# Patient Record
Sex: Female | Born: 1935 | Race: Black or African American | Hispanic: No
Health system: Southern US, Community
[De-identification: ages and names within clinical notes are randomized; demographics above are authoritative.]

## PROBLEM LIST (undated history)

## (undated) DIAGNOSIS — G9340 Encephalopathy, unspecified: Secondary | ICD-10-CM

## (undated) DIAGNOSIS — M1990 Unspecified osteoarthritis, unspecified site: Secondary | ICD-10-CM

## (undated) DIAGNOSIS — E785 Hyperlipidemia, unspecified: Secondary | ICD-10-CM

## (undated) DIAGNOSIS — I639 Cerebral infarction, unspecified: Secondary | ICD-10-CM

## (undated) DIAGNOSIS — G8100 Flaccid hemiplegia affecting unspecified side: Secondary | ICD-10-CM

## (undated) DIAGNOSIS — E079 Disorder of thyroid, unspecified: Secondary | ICD-10-CM

## (undated) DIAGNOSIS — I10 Essential (primary) hypertension: Secondary | ICD-10-CM

## (undated) DIAGNOSIS — J449 Chronic obstructive pulmonary disease, unspecified: Secondary | ICD-10-CM

## (undated) DIAGNOSIS — D509 Iron deficiency anemia, unspecified: Secondary | ICD-10-CM

## (undated) DIAGNOSIS — R41841 Cognitive communication deficit: Secondary | ICD-10-CM

## (undated) DIAGNOSIS — R1310 Dysphagia, unspecified: Secondary | ICD-10-CM

---

## 2016-07-28 ENCOUNTER — Inpatient Hospital Stay
Admission: AD | Admit: 2016-07-28 | Discharge: 2016-09-04 | Disposition: A | Discharge status: Skilled Nursing Facility | Payer: Self-pay | Source: Ambulatory Visit | Attending: Internal Medicine | Admitting: Internal Medicine

## 2016-07-28 DIAGNOSIS — R1310 Dysphagia, unspecified: Secondary | ICD-10-CM

## 2016-07-28 DIAGNOSIS — R52 Pain, unspecified: Secondary | ICD-10-CM

## 2016-07-28 DIAGNOSIS — J189 Pneumonia, unspecified organism: Secondary | ICD-10-CM

## 2016-07-28 DIAGNOSIS — Z0189 Encounter for other specified special examinations: Secondary | ICD-10-CM

## 2016-07-28 DIAGNOSIS — D72829 Elevated white blood cell count, unspecified: Secondary | ICD-10-CM

## 2016-07-28 DIAGNOSIS — J9690 Respiratory failure, unspecified, unspecified whether with hypoxia or hypercapnia: Secondary | ICD-10-CM

## 2016-07-28 DIAGNOSIS — Z431 Encounter for attention to gastrostomy: Secondary | ICD-10-CM

## 2016-07-29 ENCOUNTER — Other Ambulatory Visit (HOSPITAL_COMMUNITY): Payer: Self-pay

## 2016-07-29 DIAGNOSIS — J9601 Acute respiratory failure with hypoxia: Secondary | ICD-10-CM

## 2016-07-29 DIAGNOSIS — I63512 Cerebral infarction due to unspecified occlusion or stenosis of left middle cerebral artery: Secondary | ICD-10-CM

## 2016-07-29 LAB — COMPREHENSIVE METABOLIC PANEL
ALT: 15 U/L (ref 14–54)
AST: 32 U/L (ref 15–41)
Albumin: 1.9 g/dL — ABNORMAL LOW (ref 3.5–5.0)
Alkaline Phosphatase: 95 U/L (ref 38–126)
Anion gap: 9 (ref 5–15)
BILIRUBIN TOTAL: 0.5 mg/dL (ref 0.3–1.2)
BUN: 16 mg/dL (ref 6–20)
CO2: 22 mmol/L (ref 22–32)
CREATININE: 1.13 mg/dL — AB (ref 0.44–1.00)
Calcium: 8.9 mg/dL (ref 8.9–10.3)
Chloride: 103 mmol/L (ref 101–111)
GFR, EST AFRICAN AMERICAN: 50 mL/min — AB (ref >60)
GFR, EST NON AFRICAN AMERICAN: 43 mL/min — AB (ref >60)
Glucose, Bld: 142 mg/dL — ABNORMAL HIGH (ref 65–99)
POTASSIUM: 3.7 mmol/L (ref 3.5–5.1)
Sodium: 134 mmol/L — ABNORMAL LOW (ref 135–145)
TOTAL PROTEIN: 6.2 g/dL — AB (ref 6.5–8.1)

## 2016-07-29 LAB — BLOOD GAS, ARTERIAL
Acid-Base Excess: 1.9 mmol/L (ref 0.0–2.0)
Bicarbonate: 25.4 mmol/L (ref 20.0–28.0)
FIO2: 28
O2 Saturation: 97.7 %
PEEP: 5 cmH2O
Patient temperature: 98.6
RATE: 12 resp/min
VT: 380 mL
pCO2 arterial: 35.9 mmHg (ref 32.0–48.0)
pH, Arterial: 7.464 — ABNORMAL HIGH (ref 7.350–7.450)
pO2, Arterial: 101 mmHg (ref 83.0–108.0)

## 2016-07-29 LAB — CBC
HCT: 29.2 % — ABNORMAL LOW (ref 36.0–46.0)
Hemoglobin: 9.3 g/dL — ABNORMAL LOW (ref 12.0–15.0)
MCH: 31.7 pg (ref 26.0–34.0)
MCHC: 31.8 g/dL (ref 30.0–36.0)
MCV: 99.7 fL (ref 78.0–100.0)
PLATELETS: 354 10*3/uL (ref 150–400)
RBC: 2.93 MIL/uL — AB (ref 3.87–5.11)
RDW: 16.6 % — AB (ref 11.5–15.5)
WBC: 18.1 10*3/uL — AB (ref 4.0–10.5)

## 2016-07-29 LAB — PROTIME-INR
INR: 1.34
Prothrombin Time: 16.7 seconds — ABNORMAL HIGH (ref 11.4–15.2)

## 2016-07-29 NOTE — Consult Note (Signed)
Name: Evelyn [name] MRN: [id number] DOB: 05-Oct-1930    ADMISSION DATE:  07/28/2016 CONSULTATION DATE:  9/30  REFERRING MD :  Sharyon Medicus   CHIEF COMPLAINT:  Vent weaning   BRIEF PATIENT DESCRIPTION:  This is a 82 year old female who suffered a large left MCA stroke on 9/17. She received emergent IV TPA and underwent left ICA/MCA thrombectomy. Hospital course c/b respiratory failure on 9/20 and failure to return to progress from a neurological stand-point. She was transferred to [facility] w/ plans for trach->PEG->then SNF placement   SIGNIFICANT EVENTS    STUDIES:     HISTORY OF PRESENT ILLNESS:   82 year old female who was admitted to [facility] on 9/17 w/ right sided hemiplegia and aphasia. She was given IV TPA at [facility] then transferred emergently to [facility] for: she was given IV tPA. She was transferred to [facility] for  L ICA thrombectomy was documented at 0118 on 07/17/16 by Dr. Kelby Aline. Post-operatively she was admitted to the Neuro-ICU. F/U imaging confirmed stable large Left MCA CVA w/out hemorrhage (last CT 9/21).  Hospital course was c/b acute respiratory failure on 9/20 requiring intubation. She underwent bronchoscopy for mucous plugging, and started on zosyn for aspiration for which she completed 7 days.   PAST MEDICAL HISTORY :  HTN, OA, chronic pain on narcotics, and a hospital admission for dehydration and metabolic acidosis  Social History  . Marital status: Widowed  - non-smoker   Family History  . Heart disease Mother  . Hypertension Mother  . Stroke Brother  . Hypertension Brother  . Heart disease Daughter  . Stroke Daughter    Allergen Reactions  . Erythromycin Ethylsuccinate [E.E.S.] Unknown     Prior to Admission medications   acetaminophen (TYLENOL) 325 mg tablet Take two tablets (650 mg total) by mouth every 4 (four) hours as needed for Pain or Fever (pain level 1-2 or temperature greater than 99.6 degrees F (First choice if able to tolerate oral  medications)). Start date: 07/28/2016, End date: 08/07/2016   albuterol sulfate HFA 108 (90 Base) MCG/ACT inhaler Inhale two puffs into the lungs 4 (four) times daily. Start date: 07/28/2016, End date: 07/28/2017   aspirin 325 mg tablet Take one tablet (325 mg total) by mouth daily. Start date: 07/28/2016, End date: 07/28/2017   atorvastatin (LIPITOR) 40 mg tablet Take one tablet (40 mg total) by mouth at bedtime. Start date: 07/28/2016, End date: 07/28/2017   chlorhexidine (PERIDEX) 0.12% solution Use as directed 15 mLs in the mouth or throat 2 (two) times daily. Start date: 07/28/2016, End date: 08/11/2016   FLUoxetine (PROZAC) 20 mg/5 mL solution Take 5 mLs (20 mg total) by mouth daily. Start date: 07/28/2016, End date: 07/28/2017   heparin 5000 units/mL injection Inject 1 mL (5,000 Units total) into the skin every 8 (eight) hours. Start date: 07/28/2016      on File   Allergies not on file  FAMILY HISTORY:  family history is not on file. SOCIAL HISTORY:    REVIEW OF SYSTEMS:   Not able   SUBJECTIVE:  Minimally responsive on vent  VITAL SIGNS: 99.2 HR 57, rr 18 bp 139/58 sats 100% 28% FIO2 PHYSICAL EXAMINATION: General:  Frail chronically ill appearing AAF. Minimally responsive  Neuro:  Opens eyes to loud stimuli, + cough otherwise no movement  HEENT:  Temporal wasting. Orally intubated, poor dentition  Cardiovascular:  RRR, no MRG Lungs:  Scattered rhonchi. No accessory use. Equal chest rise on vent  Abdomen:  Soft, not tender + bowel sounds  Musculoskeletal:  Equal bulk, no swelling  Skin:  Warm and dry   Recent Labs Lab 07/29/16 0753  NA 134*  K 3.7  CL 103  CO2 22  BUN 16  CREATININE 1.13*  GLUCOSE 142*    Recent Labs Lab 07/29/16 0753  HGB 9.3*  HCT 29.2*  WBC 18.1*  PLT 354  ABG No results found for: PHART, PCO2ART, PO2ART, HCO3, TCO2, ACIDBASEDEF, O2SAT  Dg Chest Port 1 View  Result Date: 07/29/2016 CLINICAL DATA:  Respiratory failure. EXAM:  PORTABLE CHEST 1 VIEW COMPARISON:  None. FINDINGS: Endotracheal tube tip measures 2.5 cm above the carina. 2 enteric tubes are suggested with 1 tip below the left hemidiaphragm and off the field of view and the other tip projected over the distal esophagus. Left PICC line central venous catheter with tip over the low SVC region. No pneumothorax. Cardiac enlargement without vascular congestion. Calcification of the aorta. Hazy opacity in the right lung base likely to represent small effusion and basilar atelectasis. IMPRESSION: Appliances positioned as described with apparently 2 enteric tubes, 1 tip below the left hemidiaphragm and 1 tip at the level of the EG junction. Probable right pleural effusion and basilar atelectasis. Electronically Signed   By: Burman NievesWilliam  Stevens M.D.   On: 07/29/2016 03:09   Dg Abd Portable 1v  Result Date: 07/29/2016 CLINICAL DATA:  NG tube placement. EXAM: PORTABLE ABDOMEN - 1 VIEW COMPARISON:  None. FINDINGS: Enteric tube tip is in the mid abdomen consistent with location in the distal stomach. Scattered gas in the colon and small bowel without distention. No radiopaque stones. IMPRESSION: Enteric tube tip is in the mid abdomen consistent with location in the distal stomach. Nonobstructive bowel gas pattern. Electronically Signed   By: Burman NievesWilliam  Stevens M.D.   On: 07/29/2016 03:07    ASSESSMENT / PLAN: Left MCA stroke Severe dysphasia  Acute encephalopathy  Ventilator dependence  Failure to wean  Severe deconditioning  Protein calorie malnutrition  Inability to protect airway HTN Recent aspiration PNA Hyperglycemia Anemia of critical illness  Pulmonary issues: Ventilator Dependence/failure to wean s/p large Left MCA stroke ->not able to protect airway ->will need trach if family continues to desire supportive care ->prognosis for meaningful recovery poor at best.   Plan [facility] Will need ENT consult for trach Minimize sedation   Simonne MartinetPeter E  Chelby Salata ACNP-BC [facility] Pager # [phone] OR # [phone] if no answer  07/29/2016, 2:21 PM

## 2016-07-30 LAB — CBC WITH DIFFERENTIAL/PLATELET
BASOS PCT: 0 %
Basophils Absolute: 0 10*3/uL (ref 0.0–0.1)
EOS ABS: 0.4 10*3/uL (ref 0.0–0.7)
EOS PCT: 2 %
HCT: 31.5 % — ABNORMAL LOW (ref 36.0–46.0)
Hemoglobin: 9.8 g/dL — ABNORMAL LOW (ref 12.0–15.0)
Lymphocytes Relative: 4 %
Lymphs Abs: 0.7 10*3/uL (ref 0.7–4.0)
MCH: 31.4 pg (ref 26.0–34.0)
MCHC: 31.1 g/dL (ref 30.0–36.0)
MCV: 101 fL — ABNORMAL HIGH (ref 78.0–100.0)
MONO ABS: 1.6 10*3/uL — AB (ref 0.1–1.0)
MONOS PCT: 9 %
Neutro Abs: 15.9 10*3/uL — ABNORMAL HIGH (ref 1.7–7.7)
Neutrophils Relative %: 85 %
PLATELETS: 387 10*3/uL (ref 150–400)
RBC: 3.12 MIL/uL — ABNORMAL LOW (ref 3.87–5.11)
RDW: 16.5 % — AB (ref 11.5–15.5)
WBC: 18.7 10*3/uL — ABNORMAL HIGH (ref 4.0–10.5)

## 2016-07-30 LAB — BASIC METABOLIC PANEL
Anion gap: 9 (ref 5–15)
BUN: 21 mg/dL — ABNORMAL HIGH (ref 6–20)
CALCIUM: 9.4 mg/dL (ref 8.9–10.3)
CO2: 25 mmol/L (ref 22–32)
CREATININE: 1.24 mg/dL — AB (ref 0.44–1.00)
Chloride: 105 mmol/L (ref 101–111)
GFR calc non Af Amer: 39 mL/min — ABNORMAL LOW (ref >60)
GFR, EST AFRICAN AMERICAN: 45 mL/min — AB (ref >60)
Glucose, Bld: 139 mg/dL — ABNORMAL HIGH (ref 65–99)
Potassium: 4.4 mmol/L (ref 3.5–5.1)
SODIUM: 139 mmol/L (ref 135–145)

## 2016-07-31 ENCOUNTER — Other Ambulatory Visit (HOSPITAL_COMMUNITY): Payer: Self-pay

## 2016-07-31 LAB — CBC WITH DIFFERENTIAL/PLATELET
BASOS ABS: 0 10*3/uL (ref 0.0–0.1)
BASOS PCT: 0 %
EOS PCT: 2 %
Eosinophils Absolute: 0.3 10*3/uL (ref 0.0–0.7)
HEMATOCRIT: 31.7 % — AB (ref 36.0–46.0)
Hemoglobin: 9.9 g/dL — ABNORMAL LOW (ref 12.0–15.0)
LYMPHS PCT: 3 %
Lymphs Abs: 0.6 10*3/uL — ABNORMAL LOW (ref 0.7–4.0)
MCH: 31.3 pg (ref 26.0–34.0)
MCHC: 31.2 g/dL (ref 30.0–36.0)
MCV: 100.3 fL — AB (ref 78.0–100.0)
MONO ABS: 1.6 10*3/uL — AB (ref 0.1–1.0)
MONOS PCT: 8 %
Neutro Abs: 16.6 10*3/uL — ABNORMAL HIGH (ref 1.7–7.7)
Neutrophils Relative %: 87 %
PLATELETS: 409 10*3/uL — AB (ref 150–400)
RBC: 3.16 MIL/uL — ABNORMAL LOW (ref 3.87–5.11)
RDW: 16.5 % — AB (ref 11.5–15.5)
WBC: 19.1 10*3/uL — ABNORMAL HIGH (ref 4.0–10.5)

## 2016-07-31 LAB — URINE MICROSCOPIC-ADD ON

## 2016-07-31 LAB — URINALYSIS, ROUTINE W REFLEX MICROSCOPIC
BILIRUBIN URINE: NEGATIVE
GLUCOSE, UA: NEGATIVE mg/dL
KETONES UR: NEGATIVE mg/dL
Nitrite: NEGATIVE
PH: 6.5 (ref 5.0–8.0)
PROTEIN: 100 mg/dL — AB
Specific Gravity, Urine: 1.016 (ref 1.005–1.030)

## 2016-08-02 LAB — CULTURE, RESPIRATORY W GRAM STAIN: Culture: NO GROWTH

## 2016-08-02 LAB — CULTURE, RESPIRATORY

## 2016-08-03 DIAGNOSIS — Z515 Encounter for palliative care: Secondary | ICD-10-CM

## 2016-08-03 DIAGNOSIS — G9340 Encephalopathy, unspecified: Secondary | ICD-10-CM

## 2016-08-03 LAB — CBC
HCT: 25.5 % — ABNORMAL LOW (ref 36.0–46.0)
Hemoglobin: 8 g/dL — ABNORMAL LOW (ref 12.0–15.0)
MCH: 31.4 pg (ref 26.0–34.0)
MCHC: 31.4 g/dL (ref 30.0–36.0)
MCV: 100 fL (ref 78.0–100.0)
PLATELETS: 367 10*3/uL (ref 150–400)
RBC: 2.55 MIL/uL — ABNORMAL LOW (ref 3.87–5.11)
RDW: 16.3 % — AB (ref 11.5–15.5)
WBC: 12.4 10*3/uL — AB (ref 4.0–10.5)

## 2016-08-03 LAB — BASIC METABOLIC PANEL
ANION GAP: 9 (ref 5–15)
BUN: 24 mg/dL — ABNORMAL HIGH (ref 6–20)
CALCIUM: 9.4 mg/dL (ref 8.9–10.3)
CO2: 28 mmol/L (ref 22–32)
CREATININE: 1.46 mg/dL — AB (ref 0.44–1.00)
Chloride: 96 mmol/L — ABNORMAL LOW (ref 101–111)
GFR calc Af Amer: 37 mL/min — ABNORMAL LOW (ref >60)
GFR, EST NON AFRICAN AMERICAN: 32 mL/min — AB (ref >60)
GLUCOSE: 132 mg/dL — AB (ref 65–99)
Potassium: 4.5 mmol/L (ref 3.5–5.1)
Sodium: 133 mmol/L — ABNORMAL LOW (ref 135–145)

## 2016-08-03 LAB — URINE CULTURE: SPECIAL REQUESTS: NORMAL

## 2016-08-03 NOTE — Progress Notes (Signed)
Name: Evelyn [name] MRN: [id number] DOB: [birth year]    ADMISSION DATE:  07/28/2016 CONSULTATION DATE:  9/30  REFERRING MD :  Sharyon MedicusHijazi   CHIEF COMPLAINT:  Vent weaning   BRIEF PATIENT DESCRIPTION:  This is a 82 year old female who suffered a large left MCA stroke on 9/17. She received emergent IV TPA and underwent left ICA/MCA thrombectomy. Hospital course c/b respiratory failure on 9/20 and failure to return to progress from a neurological stand-point. [facility]/ [facility] for [facility]->PEG->then SNF placement   SIGNIFICANT EVENTS    STUDIES:    HISTORY OF PRESENT ILLNESS:   82 year old female who was admitted to [facility] on 9/17 w/ right sided hemiplegia and aphasia. She was given IV TPA at [place] [facility] she was given IV t[facility]. [facility]  L ICA thrombectomy was documented at 0118 on 07/17/16 by Dr. Kelby AlineJanjua. Post-operatively she was admitted to the Neuro-ICU. F/U imaging confirmed stable large Left MCA CVA w/out hemorrhage (last CT 9/21).  Hospital course was c/b acute respiratory failure on 9/20 requiring intubation. She underwent bronchoscopy for mucous plugging, and started on zosyn for aspiration for which she completed 7 days.     SUBJECTIVE:  Minimally responsive on vent  VITAL SIGNS: 99.3 97 129/73 100% 18  Ps 12  28% 5 peep Svt 350 cc    PHYSICAL EXAMINATION: General:  Frail chronically ill appearing AAF. Minimally responsive  Neuro:  Opens eyes to loud stimuli, + cough otherwise no movement  HEENT:  Temporal wasting. Orally intubated, poor dentition  Cardiovascular:  RRR, no MRG Lungs:  Scattered rhonchi. No accessory use. Equal chest rise on vent  Abdomen:  Soft, not tender + bowel sounds  Musculoskeletal:  Equal bulk, no swelling  Skin:  Warm and dry   Recent Labs Lab 07/29/16 0753 07/30/16 0617 08/03/16 0500  NA 134* 139 133*  K 3.7 4.4 4.5  CL 103 105 96*  CO2 22  25 28   BUN 16 21* 24*  CREATININE 1.13* 1.24* 1.46*  GLUCOSE 142* 139* 132*    Recent Labs Lab 07/30/16 0617 07/31/16 1117 08/03/16 0500  HGB 9.8* 9.9* 8.0*  HCT 31.5* 31.7* 25.5*  WBC 18.7* 19.1* 12.4*  PLT 387 409* 367  ABG    Component Value Date/Time   PHART 7.464 (H) 07/29/2016 1540   PCO2ART 35.9 07/29/2016 1540   PO2ART 101 07/29/2016 1540   HCO3 25.4 07/29/2016 1540   O2SAT 97.7 07/29/2016 1540    No results found.  ASSESSMENT / PLAN: Left MCA stroke Severe dysphasia  Acute encephalopathy  Ventilator dependence  Failure to wean  Severe deconditioning  Protein calorie malnutrition  Inability to protect airway HTN Recent aspiration PNA Hyperglycemia Anemia of critical illness  Pulmonary issues: Ventilator Dependence/failure to wean s/p large Left MCA stroke ->not able to protect airway ->will need trach if family continues to desire supportive care ->prognosis for meaningful recovery poor at best.   [facility] Will need ENT consult for trach Minimize sedation  PCCM will see weekly on mondays  [facility] ACNP [facility] Pager [phone] till 3 pm If no answer page [phone] 08/03/2016, 10:44 AM  Attending Note:  82 year old female with extensive PMH who is completely unresponsive on exam, remains vegetative.  I reviewed CXR myself, ETT ok.  Discussed with PCCM-NP.  This is completely futile situation unfortunately where  care provided will not result in any sort of meaningful recovery.    Respiratory failure:   - Recommend withdrawal to comfort.  Acute encephalopathy due to CVA.  - No sedation.  Hypoxemia:  - Titrate O2 for sat of 88-92%.  Airway: currently intubated.  - Not a candidate for trach/peg.  - SSH-MD to discuss plan of care.  - PCCM will not trach.  PCCM will sign off, please call back if needed.  Patient seen and examined, agree with above note.  I dictated the care and orders written for this  patient under my direction.  Alyson Reedy, MD [phone]

## 2016-08-05 LAB — BASIC METABOLIC PANEL
ANION GAP: 6 (ref 5–15)
BUN: 34 mg/dL — ABNORMAL HIGH (ref 6–20)
CO2: 29 mmol/L (ref 22–32)
Calcium: 9.3 mg/dL (ref 8.9–10.3)
Chloride: 99 mmol/L — ABNORMAL LOW (ref 101–111)
Creatinine, Ser: 1.58 mg/dL — ABNORMAL HIGH (ref 0.44–1.00)
GFR, EST AFRICAN AMERICAN: 34 mL/min — AB (ref >60)
GFR, EST NON AFRICAN AMERICAN: 29 mL/min — AB (ref >60)
GLUCOSE: 114 mg/dL — AB (ref 65–99)
POTASSIUM: 4.7 mmol/L (ref 3.5–5.1)
Sodium: 134 mmol/L — ABNORMAL LOW (ref 135–145)

## 2016-08-05 LAB — BLOOD GAS, ARTERIAL
ACID-BASE EXCESS: 3.9 mmol/L — AB (ref 0.0–2.0)
Bicarbonate: 27.7 mmol/L (ref 20.0–28.0)
FIO2: 28
O2 Saturation: 97.5 %
PEEP: 5 cmH2O
Patient temperature: 98.3
Pressure support: 5 cmH2O
pCO2 arterial: 39.5 mmHg (ref 32.0–48.0)
pH, Arterial: 7.459 — ABNORMAL HIGH (ref 7.350–7.450)
pO2, Arterial: 92.4 mmHg (ref 83.0–108.0)

## 2016-08-05 LAB — CBC
HEMATOCRIT: 24.5 % — AB (ref 36.0–46.0)
Hemoglobin: 7.6 g/dL — ABNORMAL LOW (ref 12.0–15.0)
MCH: 31.4 pg (ref 26.0–34.0)
MCHC: 31 g/dL (ref 30.0–36.0)
MCV: 101.2 fL — AB (ref 78.0–100.0)
PLATELETS: 383 10*3/uL (ref 150–400)
RBC: 2.42 MIL/uL — AB (ref 3.87–5.11)
RDW: 16.3 % — ABNORMAL HIGH (ref 11.5–15.5)
WBC: 10.4 10*3/uL (ref 4.0–10.5)

## 2016-08-10 LAB — CBC
HEMATOCRIT: 28 % — AB (ref 36.0–46.0)
HEMOGLOBIN: 8.9 g/dL — AB (ref 12.0–15.0)
MCH: 31.4 pg (ref 26.0–34.0)
MCHC: 31.8 g/dL (ref 30.0–36.0)
MCV: 98.9 fL (ref 78.0–100.0)
Platelets: 373 10*3/uL (ref 150–400)
RBC: 2.83 MIL/uL — AB (ref 3.87–5.11)
RDW: 16.3 % — ABNORMAL HIGH (ref 11.5–15.5)
WBC: 10.4 10*3/uL (ref 4.0–10.5)

## 2016-08-11 ENCOUNTER — Encounter
Admission: AD | Disposition: A | Discharge status: Skilled Nursing Facility | Payer: Self-pay | Source: Ambulatory Visit | Attending: Internal Medicine

## 2016-08-11 ENCOUNTER — Ambulatory Visit (HOSPITAL_COMMUNITY): Payer: Self-pay | Admitting: Certified Registered"

## 2016-08-11 ENCOUNTER — Institutional Professional Consult (permissible substitution) (HOSPITAL_COMMUNITY): Payer: Self-pay

## 2016-08-11 ENCOUNTER — Encounter (HOSPITAL_COMMUNITY): Payer: Self-pay | Admitting: Certified Registered"

## 2016-08-11 HISTORY — PX: TRACHEOSTOMY TUBE PLACEMENT: SHX814

## 2016-08-11 LAB — CBC
HCT: 29.2 % — ABNORMAL LOW (ref 36.0–46.0)
Hemoglobin: 9.4 g/dL — ABNORMAL LOW (ref 12.0–15.0)
MCH: 31.9 pg (ref 26.0–34.0)
MCHC: 32.2 g/dL (ref 30.0–36.0)
MCV: 99 fL (ref 78.0–100.0)
PLATELETS: 383 10*3/uL (ref 150–400)
RBC: 2.95 MIL/uL — AB (ref 3.87–5.11)
RDW: 16.3 % — AB (ref 11.5–15.5)
WBC: 14 10*3/uL — AB (ref 4.0–10.5)

## 2016-08-11 LAB — COMPREHENSIVE METABOLIC PANEL
ALT: 251 U/L — AB (ref 14–54)
AST: 156 U/L — AB (ref 15–41)
Albumin: 2.4 g/dL — ABNORMAL LOW (ref 3.5–5.0)
Alkaline Phosphatase: 881 U/L — ABNORMAL HIGH (ref 38–126)
Anion gap: 10 (ref 5–15)
BILIRUBIN TOTAL: 0.4 mg/dL (ref 0.3–1.2)
BUN: 40 mg/dL — AB (ref 6–20)
CO2: 25 mmol/L (ref 22–32)
CREATININE: 1.34 mg/dL — AB (ref 0.44–1.00)
Calcium: 10.3 mg/dL (ref 8.9–10.3)
Chloride: 103 mmol/L (ref 101–111)
GFR, EST AFRICAN AMERICAN: 41 mL/min — AB (ref >60)
GFR, EST NON AFRICAN AMERICAN: 35 mL/min — AB (ref >60)
Glucose, Bld: 110 mg/dL — ABNORMAL HIGH (ref 65–99)
POTASSIUM: 5 mmol/L (ref 3.5–5.1)
Sodium: 138 mmol/L (ref 135–145)
TOTAL PROTEIN: 7.8 g/dL (ref 6.5–8.1)

## 2016-08-11 SURGERY — CREATION, TRACHEOSTOMY
Anesthesia: General | Site: Neck

## 2016-08-11 MED ORDER — ROCURONIUM BROMIDE 100 MG/10ML IV SOLN
INTRAVENOUS | Status: DC | PRN
  Administered 2016-08-11: 20 mg via INTRAVENOUS

## 2016-08-11 MED ORDER — LIDOCAINE-EPINEPHRINE 1 %-1:100000 IJ SOLN
INTRAMUSCULAR | Status: AC
  Filled 2016-08-11: qty 1

## 2016-08-11 MED ORDER — 0.9 % SODIUM CHLORIDE (POUR BTL) OPTIME
TOPICAL | Status: DC | PRN
  Administered 2016-08-11: 1000 mL

## 2016-08-11 MED ORDER — LACTATED RINGERS IV SOLN
INTRAVENOUS | Status: DC | PRN
  Administered 2016-08-11: 11:00:00 via INTRAVENOUS

## 2016-08-11 MED ORDER — EPHEDRINE SULFATE 50 MG/ML IJ SOLN
INTRAMUSCULAR | Status: DC | PRN
  Administered 2016-08-11: 10 mg via INTRAVENOUS

## 2016-08-11 MED ORDER — LIDOCAINE-EPINEPHRINE 1 %-1:100000 IJ SOLN
INTRAMUSCULAR | Status: DC | PRN
  Administered 2016-08-11: 3 mL via INTRADERMAL

## 2016-08-11 SURGICAL SUPPLY — 43 items
ATTRACTOMAT 16X20 MAGNETIC DRP (DRAPES) IMPLANT
BLADE SURG 15 STRL LF DISP TIS (BLADE) ×2 IMPLANT
BLADE SURG 15 STRL SS (BLADE) ×4
CLEANER TIP ELECTROSURG 2X2 (MISCELLANEOUS) ×3 IMPLANT
COVER SURGICAL LIGHT HANDLE (MISCELLANEOUS) ×3 IMPLANT
DRAPE PROXIMA HALF (DRAPES) ×3 IMPLANT
ELECT COATED BLADE 2.86 ST (ELECTRODE) ×3 IMPLANT
ELECT REM PT RETURN 9FT ADLT (ELECTROSURGICAL) ×3
ELECTRODE REM PT RTRN 9FT ADLT (ELECTROSURGICAL) ×1 IMPLANT
GAUZE SPONGE 4X4 16PLY XRAY LF (GAUZE/BANDAGES/DRESSINGS) ×3 IMPLANT
GEL ULTRASOUND 20GR AQUASONIC (MISCELLANEOUS) ×3 IMPLANT
GLOVE BIO SURGEON STRL SZ7 (GLOVE) ×3 IMPLANT
GLOVE INDICATOR 7.5 STRL GRN (GLOVE) ×3 IMPLANT
GLOVE SS BIOGEL STRL SZ 7.5 (GLOVE) ×1 IMPLANT
GLOVE SUPERSENSE BIOGEL SZ 7.5 (GLOVE) ×2
GOWN STRL REUS W/ TWL LRG LVL3 (GOWN DISPOSABLE) ×1 IMPLANT
GOWN STRL REUS W/ TWL XL LVL3 (GOWN DISPOSABLE) ×2 IMPLANT
GOWN STRL REUS W/TWL LRG LVL3 (GOWN DISPOSABLE) ×2
GOWN STRL REUS W/TWL XL LVL3 (GOWN DISPOSABLE) ×4
HOLDER TRACH TUBE VELCRO 19.5 (MISCELLANEOUS) ×3 IMPLANT
KIT BASIN OR (CUSTOM PROCEDURE TRAY) ×3 IMPLANT
KIT ROOM TURNOVER OR (KITS) ×3 IMPLANT
KIT SUCTION CATH 14FR (SUCTIONS) ×3 IMPLANT
NEEDLE HYPO 25GX1X1/2 BEV (NEEDLE) ×3 IMPLANT
NS IRRIG 1000ML POUR BTL (IV SOLUTION) ×3 IMPLANT
PACK EENT II TURBAN DRAPE (CUSTOM PROCEDURE TRAY) ×3 IMPLANT
PAD ARMBOARD 7.5X6 YLW CONV (MISCELLANEOUS) ×6 IMPLANT
PENCIL BUTTON HOLSTER BLD 10FT (ELECTRODE) ×3 IMPLANT
SOL PREP POV-IOD 4OZ 10% (MISCELLANEOUS) ×3 IMPLANT
SPONGE DRAIN TRACH 4X4 STRL 2S (GAUZE/BANDAGES/DRESSINGS) ×3 IMPLANT
SPONGE INTESTINAL PEANUT (DISPOSABLE) ×3 IMPLANT
SURGILUBE 2OZ TUBE FLIPTOP (MISCELLANEOUS) ×3 IMPLANT
SUT SILK 2 0 SH CR/8 (SUTURE) ×3 IMPLANT
SUT SILK 3 0 TIES 10X30 (SUTURE) ×3 IMPLANT
SYR 5ML LUER SLIP (SYRINGE) ×3 IMPLANT
SYR CONTROL 10ML LL (SYRINGE) ×3 IMPLANT
TOWEL OR 17X24 6PK STRL BLUE (TOWEL DISPOSABLE) ×3 IMPLANT
TOWEL OR 17X26 10 PK STRL BLUE (TOWEL DISPOSABLE) ×3 IMPLANT
TUBE CONNECTING 12'X1/4 (SUCTIONS) ×1
TUBE CONNECTING 12X1/4 (SUCTIONS) ×2 IMPLANT
TUBE TRACH SHILEY  6 DIST  CUF (TUBING) ×3 IMPLANT
TUBE TRACH SHILEY 10 DIST CUFF (TUBING) IMPLANT
TUBE TRACH SHILEY 8 DIST CUF (TUBING) IMPLANT

## 2016-08-11 NOTE — Interval H&P Note (Signed)
History and Physical Interval Note:  08/11/2016 11:09 AM  Evelyn [name]  has presented today for surgery, with the diagnosis of Respiratory Failure  The various methods of treatment have been discussed with the patient and family. After consideration of risks, benefits and other options for treatment, the patient has consented to  Procedure(s): TRACHEOSTOMY (N/A) as a surgical intervention .  The patient's history has been reviewed, patient examined, no change in status, stable for surgery.  I have reviewed the patient's chart and labs.  Questions were answered to the patient's satisfaction.     [name]

## 2016-08-11 NOTE — H&P (Signed)
PREOPERATIVE H&P  Chief Complaint: VDRF  HPI: Evelyn [name] is a 82 y.o. female who presents for evaluation of tracheostomy. She's status post severe left stroke with obstruction  of the left ICA/MCA. She was intubated shortly following the stroke about 3 weeks ago. [facility]. She has been unable to wean off the vent and trach was recommended.  No past medical history on file. No past surgical history on file. Social History   Social History  . Marital status: Widowed    Spouse name: N/A  . Number of children: N/A  . Years of education: N/A   Social History Main Topics  . Smoking status: Not on file  . Smokeless tobacco: Not on file  . Alcohol use Not on file  . Drug use: Unknown  . Sexual activity: Not on file   Other Topics Concern  . Not on file   Social History Narrative  . No narrative on file   No family history on file. Allergies  Allergen Reactions  . Erythromycin Itching   Prior to Admission medications   Not on File     Positive ROS: neg  All other systems have been reviewed and were otherwise negative with the exception of those mentioned in the HPI and as above.  Physical Exam: There were no vitals filed for this visit.  General: Not responsive. Intubated on vent Oral: Nasal: NG tube in place Neck: No palpable adenopathy or thyroid nodules. Trach midline. Ear: Cardiovascular: Regular rate and rhythm, no murmur.  Respiratory: Clear to auscultation Neurologic:    Assessment/Plan: Respiratory Failure Plan for Procedure(s): TRACHEOSTOMY   Dillard CannonHRISTOPHER Lylla Eifler, MD 08/11/2016 11:03 AM

## 2016-08-11 NOTE — Transfer of Care (Signed)
Immediate Anesthesia Transfer of Care Note  Patient: Evelyn [name]  Procedure(s) Performed: Procedure(s): TRACHEOSTOMY (N/A)  Patient Location: Nursing Unit  Anesthesia Type:General  Level of Consciousness: Patient remains intubated per anesthesia plan  Airway & Oxygen Therapy: Patient remains intubated per anesthesia plan and Patient placed on Ventilator (see vital sign flow sheet for setting)  Post-op Assessment: Report given to RN and Post -op Vital signs reviewed and stable  Post vital signs: Reviewed and stable  Last Vitals: There were no vitals filed for this visit.  Last Pain: There were no vitals filed for this visit.       Complications: No apparent anesthesia complications

## 2016-08-11 NOTE — Brief Op Note (Signed)
07/28/2016 - 08/11/2016  11:47 AM  PATIENT:  Monna FamHazetta L Lenderman  82 y.o. female  PRE-OPERATIVE DIAGNOSIS:  Respiratory Failure  POST-OPERATIVE DIAGNOSIS:  Respiratory Failure  PROCEDURE:  Procedure(s): TRACHEOSTOMY (N/A) #6 Shiley cuffed  SURGEON:  Surgeon(s) and Role:    * Drema Halonhristopher E Newman, MD - Primary  PHYSICIAN ASSISTANT:   ASSISTANTS: none   ANESTHESIA:   general  EBL:  Total I/O In: 200 [I.V.:200] Out: 20 [Blood:20]  BLOOD ADMINISTERED:none  DRAINS: none   LOCAL MEDICATIONS USED:  LIDOCAINE with EPI 3 cc  SPECIMEN:  No Specimen  DISPOSITION OF SPECIMEN:  N/A  COUNTS:  YES  TOURNIQUET:  * No tourniquets in log *  DICTATION: .Other Dictation: Dictation Number [id number]  PLAN OF CARE: Discharge to home after PACU  PATIENT DISPOSITION:  PACU - hemodynamically stable.   Delay start of Pharmacological VTE agent (>24hrs) due to surgical blood loss or risk of bleeding: yes

## 2016-08-11 NOTE — Anesthesia Preprocedure Evaluation (Signed)
Anesthesia Evaluation  Patient identified by MRN, date of birth, ID band Patient unresponsive    Reviewed: Unable to perform ROS - Chart review only  Airway Mallampati: Intubated       Dental   Pulmonary  Respiratory failure.  Intubated.    + decreased breath sounds      Cardiovascular negative cardio ROS   Rhythm:regular Rate:Normal     Neuro/Psych CVA    GI/Hepatic   Endo/Other    Renal/GU Renal InsufficiencyRenal disease     Musculoskeletal   Abdominal   Peds  Hematology   Anesthesia Other Findings   Reproductive/Obstetrics                             Anesthesia Physical Anesthesia Plan  ASA: IV  Anesthesia Plan: General   Post-op Pain Management:    Induction: Inhalational  Airway Management Planned: Oral ETT and Tracheostomy  Additional Equipment:   Intra-op Plan:   Post-operative Plan: Post-operative intubation/ventilation  Informed Consent: I have reviewed the patients History and Physical, chart, labs and discussed the procedure including the risks, benefits and alternatives for the proposed anesthesia with the patient or authorized representative who has indicated his/her understanding and acceptance.     Plan Discussed with: CRNA, Anesthesiologist and Surgeon  Anesthesia Plan Comments:         Anesthesia Quick Evaluation

## 2016-08-12 LAB — CBC
HCT: 29.1 % — ABNORMAL LOW (ref 36.0–46.0)
Hemoglobin: 9.4 g/dL — ABNORMAL LOW (ref 12.0–15.0)
MCH: 32 pg (ref 26.0–34.0)
MCHC: 32.3 g/dL (ref 30.0–36.0)
MCV: 99 fL (ref 78.0–100.0)
PLATELETS: 386 10*3/uL (ref 150–400)
RBC: 2.94 MIL/uL — ABNORMAL LOW (ref 3.87–5.11)
RDW: 16.5 % — AB (ref 11.5–15.5)
WBC: 12.1 10*3/uL — ABNORMAL HIGH (ref 4.0–10.5)

## 2016-08-12 LAB — PROTIME-INR
INR: 1.29
Prothrombin Time: 16.2 seconds — ABNORMAL HIGH (ref 11.4–15.2)

## 2016-08-12 LAB — PREPARE RBC (CROSSMATCH)

## 2016-08-13 ENCOUNTER — Other Ambulatory Visit (HOSPITAL_COMMUNITY): Payer: Self-pay

## 2016-08-13 LAB — CBC
HEMATOCRIT: 26.5 % — AB (ref 36.0–46.0)
HEMOGLOBIN: 8.3 g/dL — AB (ref 12.0–15.0)
MCH: 31.3 pg (ref 26.0–34.0)
MCHC: 31.3 g/dL (ref 30.0–36.0)
MCV: 100 fL (ref 78.0–100.0)
Platelets: 379 10*3/uL (ref 150–400)
RBC: 2.65 MIL/uL — ABNORMAL LOW (ref 3.87–5.11)
RDW: 16.8 % — ABNORMAL HIGH (ref 11.5–15.5)
WBC: 13.3 10*3/uL — ABNORMAL HIGH (ref 4.0–10.5)

## 2016-08-13 LAB — BASIC METABOLIC PANEL
Anion gap: 7 (ref 5–15)
BUN: 50 mg/dL — ABNORMAL HIGH (ref 6–20)
CHLORIDE: 106 mmol/L (ref 101–111)
CO2: 29 mmol/L (ref 22–32)
CREATININE: 1.39 mg/dL — AB (ref 0.44–1.00)
Calcium: 10.2 mg/dL (ref 8.9–10.3)
GFR calc non Af Amer: 34 mL/min — ABNORMAL LOW (ref >60)
GFR, EST AFRICAN AMERICAN: 39 mL/min — AB (ref >60)
Glucose, Bld: 139 mg/dL — ABNORMAL HIGH (ref 65–99)
Potassium: 5.5 mmol/L — ABNORMAL HIGH (ref 3.5–5.1)
Sodium: 142 mmol/L (ref 135–145)

## 2016-08-14 ENCOUNTER — Other Ambulatory Visit (HOSPITAL_COMMUNITY): Payer: Self-pay

## 2016-08-14 ENCOUNTER — Encounter (HOSPITAL_COMMUNITY): Payer: Self-pay | Admitting: Otolaryngology

## 2016-08-14 LAB — COMPREHENSIVE METABOLIC PANEL
ALBUMIN: 2.8 g/dL — AB (ref 3.5–5.0)
ALT: 92 U/L — ABNORMAL HIGH (ref 14–54)
ANION GAP: 10 (ref 5–15)
AST: 45 U/L — AB (ref 15–41)
Alkaline Phosphatase: 536 U/L — ABNORMAL HIGH (ref 38–126)
BUN: 49 mg/dL — AB (ref 6–20)
CHLORIDE: 105 mmol/L (ref 101–111)
CO2: 28 mmol/L (ref 22–32)
Calcium: 10.6 mg/dL — ABNORMAL HIGH (ref 8.9–10.3)
Creatinine, Ser: 1.47 mg/dL — ABNORMAL HIGH (ref 0.44–1.00)
GFR calc Af Amer: 37 mL/min — ABNORMAL LOW (ref >60)
GFR calc non Af Amer: 32 mL/min — ABNORMAL LOW (ref >60)
GLUCOSE: 150 mg/dL — AB (ref 65–99)
POTASSIUM: 5.1 mmol/L (ref 3.5–5.1)
SODIUM: 143 mmol/L (ref 135–145)
Total Bilirubin: 0.4 mg/dL (ref 0.3–1.2)
Total Protein: 7.9 g/dL (ref 6.5–8.1)

## 2016-08-14 LAB — HEPATITIS PANEL, ACUTE
HEP A IGM: NEGATIVE
HEP B C IGM: NEGATIVE
Hepatitis B Surface Ag: NEGATIVE

## 2016-08-14 LAB — ABO/RH: ABO/RH(D): B POS

## 2016-08-14 NOTE — Anesthesia Postprocedure Evaluation (Signed)
Anesthesia Post Note  Patient: Evelyn [name] Near  Procedure(s) Performed: Procedure(s) (LRB): TRACHEOSTOMY (N/A)  Patient location during evaluation: Nursing Unit Anesthesia Type: General Level of consciousness: sedated Pain management: pain level controlled Vital Signs Assessment: post-procedure vital signs reviewed and stable Respiratory status: patient on ventilator - see flowsheet for VS (tracheostomy) Cardiovascular status: stable Anesthetic complications: no    Last Vitals: There were no vitals filed for this visit.  Last Pain: There were no vitals filed for this visit.               Anniah Glick S

## 2016-08-14 NOTE — Op Note (Signed)
NAMMarland Kitchen:  Evelyn AlaminWILLIAMS, Scottie            ACCOUNT NO.:  [id number]  MEDICAL RECORD NO.:  [id number]  LOCATION:                                 FACILITY:  PHYSICIAN:  Kristine GarbeChristopher E. Ezzard StandingNewman, M.D.DATE OF BIRTH:  [birth year]  DATE OF PROCEDURE:  08/11/2016 DATE OF DISCHARGE:                              OPERATIVE REPORT   PREOPERATIVE DIAGNOSIS:  Ventilator-dependent respiratory failure.  POSTOPERATIVE DIAGNOSIS:  Ventilator-dependent respiratory failure.  OPERATION PERFORMED:  Tracheostomy with a #6 Shiley cuffed tube.  SURGEON:  Kristine GarbeChristopher E. Ezzard StandingNewman, M.D.  ANESTHESIA:  General endotracheal.  COMPLICATIONS:  None.  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  She was intubated because of respiratory failure at the time of her stroke with severe mental status changes.  [facility] about 2 weeks ago and has been intubated and ventilation dependent since that time.  They have tried to wean her, but she has not been able to be weaned successfully.  It was recommended that she undergo a tracheotomy and family members have agreed as the patient is nonresponsive.  She was taken to the operating room at this time for tracheotomy.  DESCRIPTION OF PROCEDURE:  The [facility] to the operating room.  Neck was prepped with Betadine solution, draped out with sterile towels.  Proposed incision site was marked and injected with 3 mL of Xylocaine with epinephrine for hemostasis.  There was a large superficial vein crossing the incision site, and incision was made vertically about 2 cm midline lower neck.  The large vein was dissected out, clamped and ligated with two 3-0 silk ligature and divided.  Then using cauteries, division of the  subcutaneous tissue down to the strap muscles was identified.  The strap muscles were identified and retracted laterally and divided in midline.  The upper three tracheal rings were identified and horizontal tracheotomy was made between the second and third tracheal rings just above the thyroid isthmus.  Hemostasis was obtained with cautery. Horizontal tracheotomy was performed and endotracheal tube was removed and a #6 cuffed Shiley tracheotomy tube was inserted without any difficulty.  The patient was ventilated well.  The new tracheotomy was secured with four 2-0 silk sutures and then Velcro trach collar around the neck.  The patient was subsequently transferred back to [facility] postoperatively.    ______________________________ Kristine Garbehristopher E. Ezzard StandingNewman, M.D.   ______________________________ Kristine Garbehristopher E. Ezzard StandingNewman, M.D.    CEN/MEDQ  D:  08/11/2016  T:  08/12/2016  Job:  [id number]

## 2016-08-15 ENCOUNTER — Encounter: Payer: Self-pay | Admitting: Physician Assistant

## 2016-08-15 NOTE — H&P (Signed)
  Chief Complaint: Stroke Vent dependent respiratory failure  Referring Physician(s): Ali Hijazi  Supervising Physician: Wagner, Jaime  Patient Status: [facility] - In-pt  History of Present Illness: Evelyn [name] is a 82 y.o. female who suffered a large left MCA stroke on 9/17.   She received emergent IV TPA and underwent left ICA/MCA thrombectomy.   Hospital course was complicated by respiratory failure on 9/20 and failure to return to progress from a neurological stand-point.   She was transferred to [facility] w/ plans for tracheostomy and gastrostomy then transfer to SNF.  History reviewed. No pertinent past medical history.  Past Surgical History:  Procedure Laterality Date  . TRACHEOSTOMY TUBE PLACEMENT N/A 08/11/2016   Procedure: TRACHEOSTOMY;  Surgeon: Christopher E Newman, MD;  Location: [facility] OR;  Service: ENT;  Laterality: N/A;    Allergies: Erythromycin  Medications: Prior to Admission medications   Not on File     History reviewed. No pertinent family history.  Social History   Social History  . Marital status: Widowed    Spouse name: N/A  . Number of children: N/A  . Years of education: N/A   Social History Main Topics  . Smoking status: None  . Smokeless tobacco: None  . Alcohol use None  . Drug use: Unknown  . Sexual activity: Not Asked   Other Topics Concern  . None   Social History Narrative  . None      Review of Systems  Unable to perform ROS: Intubated    Vital Signs: There were no vitals taken for this visit.  Physical Exam  Constitutional:  Thin, NAD, on ventilator  HENT:  Head: Atraumatic.  Cardiovascular: Normal rate, regular rhythm and normal heart sounds.   Pulmonary/Chest: She has no wheezes.  Tracheostomy/Ventilator   Abdominal: Soft. She exhibits no distension. There is no tenderness.  Musculoskeletal: She exhibits no edema.  Neurological:  Opens eyes but does not follow commands  Skin: Skin  is warm and dry.  Psychiatric:  Unable to assess    Mallampati Score:  MD Evaluation Airway: Other (comments) Airway comments: Tracheostomy Heart: WNL Abdomen: WNL Chest/ Lungs: WNL ASA  Classification: 4 Mallampati/Airway Score:  (Tracheostomy)  Imaging: Dg Chest Port 1 View  Result Date: 08/11/2016 CLINICAL DATA:  Hypoxia EXAM: PORTABLE CHEST 1 VIEW COMPARISON:  July 31, 2016 FINDINGS: Endotracheal tube tip is 3.0 cm above the carina. Nasogastric tube tip and side-port are below the diaphragm. No pneumothorax. There is a minimal right pleural effusion. Lungs elsewhere clear. Heart is upper normal in size with pulmonary vascularity within normal limits. There is atherosclerotic calcification within the aorta. No adenopathy. IMPRESSION: Tube positions as described without pneumothorax. Small pleural effusion. Lungs elsewhere clear. There is aortic atherosclerosis. Electronically Signed   By: William  Woodruff III M.D.   On: 08/11/2016 07:58   Dg Chest Port 1 View  Result Date: 07/31/2016 CLINICAL DATA:  Clinical diagnosis of pneumonia. EXAM: PORTABLE CHEST 1 VIEW COMPARISON:  07/29/2016. FINDINGS: No significant change in enlargement of the cardiac silhouette. The aorta remains tortuous and calcified. Mildly decreased patchy opacity at the right lung base. Small right pleural effusion without significant change. Decreased patchy opacity at the left lung base. No acute bony abnormality. Endotracheal tube in satisfactory position. Left PICC tip in the superior vena cava. Nasogastric tube extending into the stomach with its side hole in the proximal stomach. Esophageal probe tip in the distal esophagus. IMPRESSION: 1. Improving bibasilar atelectasis and possible pneumonia. 2.   Stable small right pleural effusion. 3. Stable cardiomegaly. 4. Aortic atherosclerosis. Electronically Signed   By: Claudie Revering M.D.   On: 07/31/2016 15:35   Dg Chest Port 1 View  Result Date: 07/29/2016 CLINICAL  DATA:  Respiratory failure. EXAM: PORTABLE CHEST 1 VIEW COMPARISON:  None. FINDINGS: Endotracheal tube tip measures 2.5 cm above the carina. 2 enteric tubes are suggested with 1 tip below the left hemidiaphragm and off the field of view and the other tip projected over the distal esophagus. Left PICC line central venous catheter with tip over the low SVC region. No pneumothorax. Cardiac enlargement without vascular congestion. Calcification of the aorta. Hazy opacity in the right lung base likely to represent small effusion and basilar atelectasis. IMPRESSION: Appliances positioned as described with apparently 2 enteric tubes, 1 tip below the left hemidiaphragm and 1 tip at the level of the EG junction. Probable right pleural effusion and basilar atelectasis. Electronically Signed   By: Lucienne Capers M.D.   On: 07/29/2016 03:09   Dg Abd Portable 1v  Result Date: 07/29/2016 CLINICAL DATA:  NG tube placement. EXAM: PORTABLE ABDOMEN - 1 VIEW COMPARISON:  None. FINDINGS: Enteric tube tip is in the mid abdomen consistent with location in the distal stomach. Scattered gas in the colon and small bowel without distention. No radiopaque stones. IMPRESSION: Enteric tube tip is in the mid abdomen consistent with location in the distal stomach. Nonobstructive bowel gas pattern. Electronically Signed   By: Lucienne Capers M.D.   On: 07/29/2016 03:07   US Abdomen Limited Ruq  Result Date: 08/13/2016 CLINICAL DATA:  Acute liver failure.  Increased AST, ALT, alk loss EXAM: US ABDOMEN LIMITED - RIGHT UPPER QUADRANT COMPARISON:  None. FINDINGS: Gallbladder: Cholelithiasis. No pericholecystic fluid or gallbladder wall thickening. Murphy sign not be evaluated because the patient is on a ventilator. Common bile duct: Diameter: 11 mm Liver: No focal hepatic mass. Heterogeneous increased hepatic echogenicity with a slightly nodular contour. IMPRESSION: 1. Heterogeneous increased hepatic echogenicity with a slightly nodular  contour as can be seen with hepatic cellular disease. 2. Cholelithiasis without sonographic evidence of acute cholecystitis. 3. Mildly dilated distal common bile duct without choledocholithiasis or obstructing mass. Electronically Signed   By: Kathreen Devoid   On: 08/13/2016 15:01    Labs:  CBC:  Recent Labs  08/10/16 1352 08/11/16 1958 08/12/16 0700 08/13/16 0641  WBC 10.4 14.0* 12.1* 13.3*  HGB 8.9* 9.4* 9.4* 8.3*  HCT 28.0* 29.2* 29.1* 26.5*  PLT 373 383 386 379    COAGS:  Recent Labs  07/29/16 0753 08/12/16 0700  INR 1.34 1.29    BMP:  Recent Labs  08/05/16 0630 08/11/16 0535 08/13/16 0641 08/14/16 0830  NA 134* 138 142 143  K 4.7 5.0 5.5* 5.1  CL 99* 103 106 105  CO2 _0 GLUCOSE 114* 110* 139* 150*  BUN 34* 40* 50* 49*  CALCIUM 9.3 10.3 10.2 10.6*  CREATININE 1.58* 1.34* 1.39* 1.47*  GFRNONAA 29* 35* 34* 32*  GFRAA 34* 41* 39* 37*    LIVER FUNCTION TESTS:  Recent Labs  07/29/16 0753 08/11/16 0535 08/14/16 0830  BILITOT 0.5 0.4 0.4  AST 32 156* 45*  ALT 15 251* 92*  ALKPHOS 95 881* 536*  PROT 6.2* 7.8 7.9  ALBUMIN 1.9* 2.4* 2.8*    TUMOR MARKERS: No results for input(s): AFPTM, CEA, CA199, CHROMGRNA in the last 8760 hours.  Assessment and Plan:  CVA with profound sequela  Trach/vent  Need  for long term care.  Will proceed with placement of percutaneous gastrostomy tomorrow.  There was no family available at the time of my visit.  I have requested [facility] to obtain consent.  Thank you for this interesting consult.  I greatly enjoyed meeting Evelyn [name] and look forward to participating in their care.  A copy of this report was sent to the requesting provider on this date.  Electronically Signed: Murrell Redden PA-C 08/15/2016, 9:04 AM   I spent a total of 20 Minutes in face to face in clinical consultation, greater than 50% of which was counseling/coordinating care for Gastrostomy tube placement.

## 2016-08-16 ENCOUNTER — Other Ambulatory Visit (HOSPITAL_COMMUNITY): Payer: Self-pay

## 2016-08-16 ENCOUNTER — Encounter (HOSPITAL_COMMUNITY): Payer: Self-pay | Admitting: Interventional Radiology

## 2016-08-16 HISTORY — PX: IR GENERIC HISTORICAL: IMG1180011

## 2016-08-16 LAB — TYPE AND SCREEN
ABO/RH(D): B POS
ANTIBODY SCREEN: NEGATIVE
Unit division: 0
Unit division: 0

## 2016-08-16 MED ORDER — SODIUM CHLORIDE 0.9 % IV SOLN
INTRAVENOUS | Status: AC | PRN
  Administered 2016-08-16: 30 mL/h via INTRAVENOUS

## 2016-08-16 MED ORDER — LIDOCAINE HCL 1 % IJ SOLN
INTRAMUSCULAR | Status: AC | PRN
  Administered 2016-08-16: 10 mL

## 2016-08-16 MED ORDER — GLUCAGON HCL RDNA (DIAGNOSTIC) 1 MG IJ SOLR
INTRAMUSCULAR | Status: AC | PRN
  Administered 2016-08-16: 1 mg via INTRAVENOUS

## 2016-08-16 MED ORDER — FENTANYL CITRATE (PF) 100 MCG/2ML IJ SOLN
INTRAMUSCULAR | Status: AC
  Filled 2016-08-16: qty 2

## 2016-08-16 MED ORDER — FENTANYL CITRATE (PF) 100 MCG/2ML IJ SOLN
INTRAMUSCULAR | Status: AC | PRN
  Administered 2016-08-16 (×2): 25 ug via INTRAVENOUS

## 2016-08-16 MED ORDER — GLUCAGON HCL RDNA (DIAGNOSTIC) 1 MG IJ SOLR
INTRAMUSCULAR | Status: AC
  Filled 2016-08-16: qty 1

## 2016-08-16 MED ORDER — CEFAZOLIN IN D5W 1 GM/50ML IV SOLN
1.0000 g | Freq: Once | INTRAVENOUS | Status: AC
  Administered 2016-08-16: 1 g via INTRAVENOUS
  Filled 2016-08-16: qty 50

## 2016-08-16 MED ORDER — LIDOCAINE HCL 1 % IJ SOLN
INTRAMUSCULAR | Status: AC
  Filled 2016-08-16: qty 20

## 2016-08-16 MED ORDER — MIDAZOLAM HCL 2 MG/2ML IJ SOLN
INTRAMUSCULAR | Status: AC | PRN
  Administered 2016-08-16: 0.5 mg via INTRAVENOUS
  Administered 2016-08-16: 1 mg via INTRAVENOUS

## 2016-08-16 MED ORDER — IOPAMIDOL (ISOVUE-300) INJECTION 61%
INTRAVENOUS | Status: AC
  Administered 2016-08-16: 15 mL
  Filled 2016-08-16: qty 50

## 2016-08-16 MED ORDER — MIDAZOLAM HCL 2 MG/2ML IJ SOLN
INTRAMUSCULAR | Status: AC
  Filled 2016-08-16: qty 2

## 2016-08-16 NOTE — Sedation Documentation (Signed)
Tolerated G tube insertion well, no signs of distress. [place] floor RN here, [facility]

## 2016-08-16 NOTE — Sedation Documentation (Signed)
Patient is resting comfortably. 

## 2016-08-16 NOTE — Procedures (Signed)
Interventional Radiology Procedure Note  Procedure: Placement of percutaneous 20F pull-through gastrostomy tube. Complications: None Recommendations: - NPO except for sips and chips remainder of today and overnight - Maintain G-tube to LWS until tomorrow morning  - May advance diet as tolerated and begin using tube tomorrow morning  Signed,  Emmajo Bennette K. Kasandra Fehr, MD   

## 2016-08-16 NOTE — Sedation Documentation (Addendum)
OG being placed by Cheryll Dette, RT trach suctioned for small amount white sectretions

## 2016-08-17 NOTE — Progress Notes (Signed)
Patient ID: Evelyn [name], female   DOB: 1929/11/03, 82 y.o.   MRN: [id number]    Referring Physician(s): Dr. Merton Border  Supervising Physician: Sandi Mariscal  Patient Status: inpt  Chief Complaint: VDRF  Subjective: Patient does not communicate  Allergies: Erythromycin  Medications: Prior to Admission medications   Not on File    Vital Signs: BP (!) 114/49   Pulse 72   Resp 18   SpO2 99%   Physical Exam: Abd: soft, G-tube in place with no evidence of bleeding or infection.  Imaging: Ct Abdomen Wo Contrast  Result Date: 08/15/2016 CLINICAL DATA:  Evaluate anatomy for gastrostomy tube placement. EXAM: CT ABDOMEN WITHOUT CONTRAST TECHNIQUE: Multidetector CT imaging of the abdomen was performed following the standard protocol without IV contrast. COMPARISON:  None. FINDINGS: Lower chest: Small right pleural effusion. Coronary artery calcifications. Mitral annular calcifications. Hepatobiliary: No gross abnormality to the liver or gallbladder on this noncontrast examination. However, the distal common bile duct is prominent measuring up to 1 cm. Pancreas: Normal appearance of the pancreas without inflammation or duct dilatation. Spleen: Normal appearance of spleen without enlargement. Adrenals/Urinary Tract: Normal appearance of the adrenal glands. No gross abnormality to the kidneys without hydronephrosis. Stomach/Bowel: Nasogastric tube extends into the stomach body. Stomach body is directly below the upper abdominal wall. There is a large amount of high-density fluid in the colon. No significant small bowel dilatation. Vascular/Lymphatic: Vascular structures are heavily calcified without an aortic aneurysm. No significant lymphadenopathy in the upper abdomen. Other: No ascites. Musculoskeletal: No bone abnormality. IMPRESSION: Patient's anatomy would be amendable to a gastrostomy tube placement. However, the colon is distended with a large amount of fluid which should be  considered during a gastrostomy tube placement. Small right pleural effusion. Electronically Signed   By: Markus Daft M.D.   On: 08/15/2016 21:45   Ir Gastrostomy Tube Mod Sed  Result Date: 08/16/2016 INDICATION: 82 year old female with ventilator dependent respiratory failure. Percutaneous gastrostomy tube is warranted. EXAM: Fluoroscopically guided placement of percutaneous pull-through gastrostomy tube Interventional Radiologist:  Criselda Peaches, MD MEDICATIONS: 1 g Ancef and 1 mg glucagon administered intravenously; Antibiotics were administered within 1 hour of the procedure. ANESTHESIA/SEDATION: Versed 1.5 mg IV; Fentanyl 50 mcg IV Moderate Sedation Time:  20 minutes The patient was continuously monitored during the procedure by the interventional radiology nurse under my direct supervision. CONTRAST:  59m ISOVUE-300 IOPAMIDOL (ISOVUE-300) INJECTION 61% FLUOROSCOPY TIME:  Fluoroscopy Time: 11 minutes 36 seconds (12 mGy). COMPLICATIONS: None immediate. PROCEDURE: Informed written consent was obtained from the patient after a thorough discussion of the procedural risks, benefits and alternatives. All questions were addressed. Maximal Sterile Barrier Technique was utilized including caps, mask, sterile gowns, sterile gloves, sterile drape, hand hygiene and skin antiseptic. A timeout was performed prior to the initiation of the procedure. Maximal barrier sterile technique utilized including caps, mask, sterile gowns, sterile gloves, large sterile drape, hand hygiene, and chlorhexadine skin prep. An angled catheter was advanced over a wire under fluoroscopic guidance through the nose, down the esophagus and into the body of the stomach. The stomach was then insufflated with several 100 ml of air. Fluoroscopy confirmed location of the gastric bubble, as well as inferior displacement of the barium stained colon. Under direct fluoroscopic guidance, a single T-tack was placed, and the anterior gastric wall  drawn up against the anterior abdominal wall. Percutaneous access was then obtained into the mid gastric body with an 18 gauge sheath needle. Aspiration of air, and injection  of contrast material under fluoroscopy confirmed needle placement. An Amplatz wire was advanced in the gastric body and the access needle exchanged for a 9-French vascular sheath. A snare device was advanced through the vascular sheath and an Amplatz wire advanced through the angled catheter. The Amplatz wire was successfully snared and this was pulled up through the esophagus and out the mouth. A 20-French Alinda Dooms MIC-PEG tube was then connected to the snare and pulled through the mouth, down the esophagus, into the stomach and out to the anterior abdominal wall. Hand injection of contrast material confirmed intragastric location. The T-tack retention suture was then cut. The pull through peg tube was then secured with the external bumper and capped. The patient will be observed for several hours with the newly placed tube on low wall suction to evaluate for any post procedure complication. The patient tolerated the procedure well, there is no immediate complication. IMPRESSION: Successful placement of a 20 French pull through gastrostomy tube. Signed, Criselda Peaches, MD [facility] Radiology Electronically Signed   By: Jacqulynn Cadet M.D.   On: 08/16/2016 12:59   US Abdomen Limited Ruq  Result Date: 08/13/2016 CLINICAL DATA:  Acute liver failure.  Increased AST, ALT, alk loss EXAM: US ABDOMEN LIMITED - RIGHT UPPER QUADRANT COMPARISON:  None. FINDINGS: Gallbladder: Cholelithiasis. No pericholecystic fluid or gallbladder wall thickening. Murphy sign not be evaluated because the patient is on a ventilator. Common bile duct: Diameter: 11 mm Liver: No focal hepatic mass. Heterogeneous increased hepatic echogenicity with a slightly nodular contour. IMPRESSION: 1. Heterogeneous  increased hepatic echogenicity with a slightly nodular contour as can be seen with hepatic cellular disease. 2. Cholelithiasis without sonographic evidence of acute cholecystitis. 3. Mildly dilated distal common bile duct without choledocholithiasis or obstructing mass. Electronically Signed   By: Kathreen Devoid   On: 08/13/2016 15:01    Labs:  CBC:  Recent Labs  08/10/16 1352 08/11/16 1958 08/12/16 0700 08/13/16 0641  WBC 10.4 14.0* 12.1* 13.3*  HGB 8.9* 9.4* 9.4* 8.3*  HCT 28.0* 29.2* 29.1* 26.5*  PLT 373 383 386 379    COAGS:  Recent Labs  07/29/16 0753 08/12/16 0700  INR 1.34 1.29    BMP:  Recent Labs  08/05/16 0630 08/11/16 0535 08/13/16 0641 08/14/16 0830  NA 134* 138 142 143  K 4.7 5.0 5.5* 5.1  CL 99* 103 106 105  CO2 _0 GLUCOSE 114* 110* 139* 150*  BUN 34* 40* 50* 49*  CALCIUM 9.3 10.3 10.2 10.6*  CREATININE 1.58* 1.34* 1.39* 1.47*  GFRNONAA 29* 35* 34* 32*  GFRAA 34* 41* 39* 37*    LIVER FUNCTION TESTS:  Recent Labs  07/29/16 0753 08/11/16 0535 08/14/16 0830  BILITOT 0.5 0.4 0.4  AST 32 156* 45*  ALT 15 251* 92*  ALKPHOS 95 881* 536*  PROT 6.2* 7.8 7.9  ALBUMIN 1.9* 2.4* 2.8*    Assessment and Plan: 1. S/p g-tube placement on 10/18 -g-tube in good position -may start using g-tube for medications and feedings -call with questions  Electronically Signed: Tyjae Shvartsman E 08/17/2016, 9:39 AM   I spent a total of 15 Minutes at the the patient's bedside AND on the patient's hospital floor or unit, greater than 50% of which was counseling/coordinating care for VDRF, s/p g-tube placement

## 2016-08-19 LAB — CBC
HEMATOCRIT: 32.2 % — AB (ref 36.0–46.0)
Hemoglobin: 10.2 g/dL — ABNORMAL LOW (ref 12.0–15.0)
MCH: 32.7 pg (ref 26.0–34.0)
MCHC: 31.7 g/dL (ref 30.0–36.0)
MCV: 103.2 fL — AB (ref 78.0–100.0)
PLATELETS: 577 10*3/uL — AB (ref 150–400)
RBC: 3.12 MIL/uL — AB (ref 3.87–5.11)
RDW: 16.8 % — ABNORMAL HIGH (ref 11.5–15.5)
WBC: 11 10*3/uL — AB (ref 4.0–10.5)

## 2016-08-19 LAB — BASIC METABOLIC PANEL
ANION GAP: 11 (ref 5–15)
BUN: 84 mg/dL — ABNORMAL HIGH (ref 6–20)
CHLORIDE: 109 mmol/L (ref 101–111)
CO2: 27 mmol/L (ref 22–32)
Calcium: 11.2 mg/dL — ABNORMAL HIGH (ref 8.9–10.3)
Creatinine, Ser: 2.4 mg/dL — ABNORMAL HIGH (ref 0.44–1.00)
GFR calc non Af Amer: 17 mL/min — ABNORMAL LOW (ref >60)
GFR, EST AFRICAN AMERICAN: 20 mL/min — AB (ref >60)
Glucose, Bld: 198 mg/dL — ABNORMAL HIGH (ref 65–99)
Potassium: 3.9 mmol/L (ref 3.5–5.1)
Sodium: 147 mmol/L — ABNORMAL HIGH (ref 135–145)

## 2016-08-21 LAB — BASIC METABOLIC PANEL
ANION GAP: 9 (ref 5–15)
BUN: 60 mg/dL — ABNORMAL HIGH (ref 6–20)
CHLORIDE: 115 mmol/L — AB (ref 101–111)
CO2: 25 mmol/L (ref 22–32)
Calcium: 10.3 mg/dL (ref 8.9–10.3)
Creatinine, Ser: 1.56 mg/dL — ABNORMAL HIGH (ref 0.44–1.00)
GFR calc Af Amer: 34 mL/min — ABNORMAL LOW (ref >60)
GFR, EST NON AFRICAN AMERICAN: 29 mL/min — AB (ref >60)
Glucose, Bld: 180 mg/dL — ABNORMAL HIGH (ref 65–99)
POTASSIUM: 3.7 mmol/L (ref 3.5–5.1)
Sodium: 149 mmol/L — ABNORMAL HIGH (ref 135–145)

## 2016-08-23 LAB — BASIC METABOLIC PANEL
Anion gap: 9 (ref 5–15)
BUN: 38 mg/dL — ABNORMAL HIGH (ref 6–20)
CHLORIDE: 106 mmol/L (ref 101–111)
CO2: 25 mmol/L (ref 22–32)
CREATININE: 1.18 mg/dL — AB (ref 0.44–1.00)
Calcium: 10.2 mg/dL (ref 8.9–10.3)
GFR, EST AFRICAN AMERICAN: 48 mL/min — AB (ref >60)
GFR, EST NON AFRICAN AMERICAN: 41 mL/min — AB (ref >60)
Glucose, Bld: 156 mg/dL — ABNORMAL HIGH (ref 65–99)
POTASSIUM: 3.2 mmol/L — AB (ref 3.5–5.1)
SODIUM: 140 mmol/L (ref 135–145)

## 2016-08-23 LAB — CBC
HCT: 28.4 % — ABNORMAL LOW (ref 36.0–46.0)
HEMOGLOBIN: 8.9 g/dL — AB (ref 12.0–15.0)
MCH: 32 pg (ref 26.0–34.0)
MCHC: 31.3 g/dL (ref 30.0–36.0)
MCV: 102.2 fL — ABNORMAL HIGH (ref 78.0–100.0)
Platelets: 471 10*3/uL — ABNORMAL HIGH (ref 150–400)
RBC: 2.78 MIL/uL — AB (ref 3.87–5.11)
RDW: 16.6 % — ABNORMAL HIGH (ref 11.5–15.5)
WBC: 14 10*3/uL — ABNORMAL HIGH (ref 4.0–10.5)

## 2016-08-24 LAB — POTASSIUM: POTASSIUM: 3.7 mmol/L (ref 3.5–5.1)

## 2016-08-26 LAB — BASIC METABOLIC PANEL
ANION GAP: 8 (ref 5–15)
BUN: 26 mg/dL — ABNORMAL HIGH (ref 6–20)
CALCIUM: 9.4 mg/dL (ref 8.9–10.3)
CO2: 23 mmol/L (ref 22–32)
Chloride: 103 mmol/L (ref 101–111)
Creatinine, Ser: 1.07 mg/dL — ABNORMAL HIGH (ref 0.44–1.00)
GFR, EST AFRICAN AMERICAN: 54 mL/min — AB (ref >60)
GFR, EST NON AFRICAN AMERICAN: 46 mL/min — AB (ref >60)
GLUCOSE: 153 mg/dL — AB (ref 65–99)
Potassium: 3.6 mmol/L (ref 3.5–5.1)
Sodium: 134 mmol/L — ABNORMAL LOW (ref 135–145)

## 2016-08-26 LAB — CBC
HCT: 24.7 % — ABNORMAL LOW (ref 36.0–46.0)
Hemoglobin: 7.9 g/dL — ABNORMAL LOW (ref 12.0–15.0)
MCH: 32 pg (ref 26.0–34.0)
MCHC: 32 g/dL (ref 30.0–36.0)
MCV: 100 fL (ref 78.0–100.0)
PLATELETS: 375 10*3/uL (ref 150–400)
RBC: 2.47 MIL/uL — ABNORMAL LOW (ref 3.87–5.11)
RDW: 16.7 % — AB (ref 11.5–15.5)
WBC: 16.4 10*3/uL — AB (ref 4.0–10.5)

## 2016-08-27 ENCOUNTER — Other Ambulatory Visit (HOSPITAL_COMMUNITY): Payer: Self-pay

## 2016-08-27 LAB — CBC WITH DIFFERENTIAL/PLATELET
BASOS ABS: 0 10*3/uL (ref 0.0–0.1)
Basophils Relative: 0 %
EOS ABS: 0.3 10*3/uL (ref 0.0–0.7)
Eosinophils Relative: 2 %
HEMATOCRIT: 25 % — AB (ref 36.0–46.0)
Hemoglobin: 8 g/dL — ABNORMAL LOW (ref 12.0–15.0)
LYMPHS ABS: 1.5 10*3/uL (ref 0.7–4.0)
Lymphocytes Relative: 9 %
MCH: 32.4 pg (ref 26.0–34.0)
MCHC: 32 g/dL (ref 30.0–36.0)
MCV: 101.2 fL — ABNORMAL HIGH (ref 78.0–100.0)
MONO ABS: 1.8 10*3/uL — AB (ref 0.1–1.0)
MONOS PCT: 11 %
NEUTROS ABS: 12.6 10*3/uL — AB (ref 1.7–7.7)
Neutrophils Relative %: 78 %
PLATELETS: 379 10*3/uL (ref 150–400)
RBC: 2.47 MIL/uL — AB (ref 3.87–5.11)
RDW: 16.8 % — AB (ref 11.5–15.5)
WBC: 16.2 10*3/uL — AB (ref 4.0–10.5)

## 2016-08-27 LAB — URINALYSIS, ROUTINE W REFLEX MICROSCOPIC
BILIRUBIN URINE: NEGATIVE
Glucose, UA: NEGATIVE mg/dL
KETONES UR: NEGATIVE mg/dL
NITRITE: NEGATIVE
PH: 7 (ref 5.0–8.0)
PROTEIN: 100 mg/dL — AB
Specific Gravity, Urine: 1.011 (ref 1.005–1.030)

## 2016-08-27 LAB — URINE MICROSCOPIC-ADD ON

## 2016-08-27 LAB — BASIC METABOLIC PANEL
ANION GAP: 10 (ref 5–15)
BUN: 25 mg/dL — ABNORMAL HIGH (ref 6–20)
CALCIUM: 9.5 mg/dL (ref 8.9–10.3)
CO2: 23 mmol/L (ref 22–32)
CREATININE: 1.05 mg/dL — AB (ref 0.44–1.00)
Chloride: 100 mmol/L — ABNORMAL LOW (ref 101–111)
GFR, EST AFRICAN AMERICAN: 55 mL/min — AB (ref >60)
GFR, EST NON AFRICAN AMERICAN: 47 mL/min — AB (ref >60)
Glucose, Bld: 130 mg/dL — ABNORMAL HIGH (ref 65–99)
Potassium: 3.4 mmol/L — ABNORMAL LOW (ref 3.5–5.1)
SODIUM: 133 mmol/L — AB (ref 135–145)

## 2016-08-28 LAB — CBC WITH DIFFERENTIAL/PLATELET
Basophils Absolute: 0 10*3/uL (ref 0.0–0.1)
Basophils Relative: 0 %
Eosinophils Absolute: 0.4 10*3/uL (ref 0.0–0.7)
Eosinophils Relative: 3 %
HEMATOCRIT: 27.6 % — AB (ref 36.0–46.0)
HEMOGLOBIN: 9.1 g/dL — AB (ref 12.0–15.0)
LYMPHS ABS: 1.3 10*3/uL (ref 0.7–4.0)
LYMPHS PCT: 10 %
MCH: 32.9 pg (ref 26.0–34.0)
MCHC: 33 g/dL (ref 30.0–36.0)
MCV: 99.6 fL (ref 78.0–100.0)
MONOS PCT: 8 %
Monocytes Absolute: 1 10*3/uL (ref 0.1–1.0)
NEUTROS ABS: 10.1 10*3/uL — AB (ref 1.7–7.7)
NEUTROS PCT: 79 %
Platelets: 356 10*3/uL (ref 150–400)
RBC: 2.77 MIL/uL — AB (ref 3.87–5.11)
RDW: 16.4 % — ABNORMAL HIGH (ref 11.5–15.5)
WBC: 12.7 10*3/uL — AB (ref 4.0–10.5)

## 2016-08-28 LAB — BASIC METABOLIC PANEL
Anion gap: 9 (ref 5–15)
BUN: 26 mg/dL — AB (ref 6–20)
CHLORIDE: 100 mmol/L — AB (ref 101–111)
CO2: 23 mmol/L (ref 22–32)
CREATININE: 0.97 mg/dL (ref 0.44–1.00)
Calcium: 9.8 mg/dL (ref 8.9–10.3)
GFR calc Af Amer: >60 mL/min (ref >60)
GFR calc non Af Amer: 52 mL/min — ABNORMAL LOW (ref >60)
Glucose, Bld: 153 mg/dL — ABNORMAL HIGH (ref 65–99)
POTASSIUM: 4 mmol/L (ref 3.5–5.1)
SODIUM: 132 mmol/L — AB (ref 135–145)

## 2016-08-29 LAB — URINE CULTURE: Culture: >=100000 — AB

## 2016-08-29 LAB — TSH: TSH: 5.317 u[IU]/mL — ABNORMAL HIGH (ref 0.350–4.500)

## 2016-08-31 LAB — BASIC METABOLIC PANEL
Anion gap: 10 (ref 5–15)
BUN: 33 mg/dL — ABNORMAL HIGH (ref 6–20)
CHLORIDE: 103 mmol/L (ref 101–111)
CO2: 21 mmol/L — ABNORMAL LOW (ref 22–32)
CREATININE: 0.99 mg/dL (ref 0.44–1.00)
Calcium: 9.9 mg/dL (ref 8.9–10.3)
GFR calc non Af Amer: 51 mL/min — ABNORMAL LOW (ref >60)
GFR, EST AFRICAN AMERICAN: 59 mL/min — AB (ref >60)
Glucose, Bld: 142 mg/dL — ABNORMAL HIGH (ref 65–99)
POTASSIUM: 4.8 mmol/L (ref 3.5–5.1)
SODIUM: 134 mmol/L — AB (ref 135–145)

## 2016-10-19 ENCOUNTER — Encounter (HOSPITAL_COMMUNITY): Payer: Self-pay

## 2016-10-19 ENCOUNTER — Emergency Department (HOSPITAL_COMMUNITY): Payer: Medicare Other

## 2016-10-19 ENCOUNTER — Inpatient Hospital Stay (HOSPITAL_COMMUNITY)
Admission: EM | Admit: 2016-10-19 | Discharge: 2016-10-24 | DRG: 178 | Disposition: A | Discharge status: Skilled Nursing Facility | Payer: Medicare Other | Attending: Internal Medicine | Admitting: Internal Medicine

## 2016-10-19 DIAGNOSIS — Z794 Long term (current) use of insulin: Secondary | ICD-10-CM | POA: Diagnosis not present

## 2016-10-19 DIAGNOSIS — Z888 Allergy status to other drugs, medicaments and biological substances status: Secondary | ICD-10-CM | POA: Diagnosis not present

## 2016-10-19 DIAGNOSIS — Z931 Gastrostomy status: Secondary | ICD-10-CM

## 2016-10-19 DIAGNOSIS — E875 Hyperkalemia: Secondary | ICD-10-CM | POA: Diagnosis present

## 2016-10-19 DIAGNOSIS — Z8673 Personal history of transient ischemic attack (TIA), and cerebral infarction without residual deficits: Secondary | ICD-10-CM

## 2016-10-19 DIAGNOSIS — Z930 Tracheostomy status: Secondary | ICD-10-CM | POA: Diagnosis not present

## 2016-10-19 DIAGNOSIS — I69320 Aphasia following cerebral infarction: Secondary | ICD-10-CM

## 2016-10-19 DIAGNOSIS — Z7952 Long term (current) use of systemic steroids: Secondary | ICD-10-CM | POA: Diagnosis not present

## 2016-10-19 DIAGNOSIS — E876 Hypokalemia: Secondary | ICD-10-CM | POA: Diagnosis present

## 2016-10-19 DIAGNOSIS — E785 Hyperlipidemia, unspecified: Secondary | ICD-10-CM

## 2016-10-19 DIAGNOSIS — J9611 Chronic respiratory failure with hypoxia: Secondary | ICD-10-CM | POA: Diagnosis present

## 2016-10-19 DIAGNOSIS — J449 Chronic obstructive pulmonary disease, unspecified: Secondary | ICD-10-CM

## 2016-10-19 DIAGNOSIS — J189 Pneumonia, unspecified organism: Secondary | ICD-10-CM

## 2016-10-19 DIAGNOSIS — E119 Type 2 diabetes mellitus without complications: Secondary | ICD-10-CM

## 2016-10-19 DIAGNOSIS — Z7982 Long term (current) use of aspirin: Secondary | ICD-10-CM

## 2016-10-19 DIAGNOSIS — D539 Nutritional anemia, unspecified: Secondary | ICD-10-CM | POA: Diagnosis present

## 2016-10-19 DIAGNOSIS — E1165 Type 2 diabetes mellitus with hyperglycemia: Secondary | ICD-10-CM | POA: Diagnosis present

## 2016-10-19 DIAGNOSIS — Z7189 Other specified counseling: Secondary | ICD-10-CM | POA: Diagnosis not present

## 2016-10-19 DIAGNOSIS — Z515 Encounter for palliative care: Secondary | ICD-10-CM | POA: Diagnosis present

## 2016-10-19 DIAGNOSIS — E870 Hyperosmolality and hypernatremia: Secondary | ICD-10-CM | POA: Diagnosis present

## 2016-10-19 DIAGNOSIS — Z79899 Other long term (current) drug therapy: Secondary | ICD-10-CM | POA: Diagnosis not present

## 2016-10-19 DIAGNOSIS — J15212 Pneumonia due to Methicillin resistant Staphylococcus aureus: Secondary | ICD-10-CM | POA: Diagnosis not present

## 2016-10-19 DIAGNOSIS — N179 Acute kidney failure, unspecified: Secondary | ICD-10-CM | POA: Diagnosis present

## 2016-10-19 DIAGNOSIS — Z9981 Dependence on supplemental oxygen: Secondary | ICD-10-CM

## 2016-10-19 DIAGNOSIS — E860 Dehydration: Secondary | ICD-10-CM | POA: Diagnosis present

## 2016-10-19 DIAGNOSIS — Z7401 Bed confinement status: Secondary | ICD-10-CM

## 2016-10-19 DIAGNOSIS — G8190 Hemiplegia, unspecified affecting unspecified side: Secondary | ICD-10-CM | POA: Diagnosis present

## 2016-10-19 DIAGNOSIS — R195 Other fecal abnormalities: Secondary | ICD-10-CM

## 2016-10-19 DIAGNOSIS — E039 Hypothyroidism, unspecified: Secondary | ICD-10-CM

## 2016-10-19 DIAGNOSIS — R7989 Other specified abnormal findings of blood chemistry: Secondary | ICD-10-CM

## 2016-10-19 DIAGNOSIS — I10 Essential (primary) hypertension: Secondary | ICD-10-CM

## 2016-10-19 DIAGNOSIS — L8990 Pressure ulcer of unspecified site, unspecified stage: Secondary | ICD-10-CM | POA: Insufficient documentation

## 2016-10-19 DIAGNOSIS — D649 Anemia, unspecified: Secondary | ICD-10-CM | POA: Diagnosis not present

## 2016-10-19 DIAGNOSIS — Z881 Allergy status to other antibiotic agents status: Secondary | ICD-10-CM

## 2016-10-19 DIAGNOSIS — B3781 Candidal esophagitis: Secondary | ICD-10-CM | POA: Diagnosis not present

## 2016-10-19 DIAGNOSIS — R7881 Bacteremia: Secondary | ICD-10-CM | POA: Diagnosis not present

## 2016-10-19 DIAGNOSIS — J15211 Pneumonia due to Methicillin susceptible Staphylococcus aureus: Principal | ICD-10-CM | POA: Diagnosis present

## 2016-10-19 HISTORY — DX: Hyperlipidemia, unspecified: E78.5

## 2016-10-19 HISTORY — DX: Iron deficiency anemia, unspecified: D50.9

## 2016-10-19 HISTORY — DX: Cognitive communication deficit: R41.841

## 2016-10-19 HISTORY — DX: Essential (primary) hypertension: I10

## 2016-10-19 HISTORY — DX: Encephalopathy, unspecified: G93.40

## 2016-10-19 HISTORY — DX: Disorder of thyroid, unspecified: E07.9

## 2016-10-19 HISTORY — DX: Cerebral infarction, unspecified: I63.9

## 2016-10-19 HISTORY — DX: Dysphagia, unspecified: R13.10

## 2016-10-19 HISTORY — DX: Chronic obstructive pulmonary disease, unspecified: J44.9

## 2016-10-19 HISTORY — DX: Flaccid hemiplegia affecting unspecified side: G81.00

## 2016-10-19 HISTORY — DX: Unspecified osteoarthritis, unspecified site: M19.90

## 2016-10-19 LAB — URINALYSIS, ROUTINE W REFLEX MICROSCOPIC
BILIRUBIN URINE: NEGATIVE
Glucose, UA: NEGATIVE mg/dL
Hgb urine dipstick: NEGATIVE
Ketones, ur: NEGATIVE mg/dL
NITRITE: NEGATIVE
PH: 5 (ref 5.0–8.0)
Protein, ur: NEGATIVE mg/dL
SPECIFIC GRAVITY, URINE: 1.013 (ref 1.005–1.030)

## 2016-10-19 LAB — CREATININE, SERUM
CREATININE: 2.42 mg/dL — AB (ref 0.44–1.00)
GFR calc Af Amer: 20 mL/min — ABNORMAL LOW (ref >60)
GFR, EST NON AFRICAN AMERICAN: 17 mL/min — AB (ref >60)

## 2016-10-19 LAB — VITAMIN B12: VITAMIN B 12: 879 pg/mL (ref 180–914)

## 2016-10-19 LAB — BASIC METABOLIC PANEL
ANION GAP: 10 (ref 5–15)
BUN: 101 mg/dL — ABNORMAL HIGH (ref 6–20)
CO2: 16 mmol/L — AB (ref 22–32)
CREATININE: 2.25 mg/dL — AB (ref 0.44–1.00)
Calcium: 9.1 mg/dL (ref 8.9–10.3)
Chloride: 129 mmol/L — ABNORMAL HIGH (ref 101–111)
GFR calc non Af Amer: 19 mL/min — ABNORMAL LOW (ref >60)
GFR, EST AFRICAN AMERICAN: 22 mL/min — AB (ref >60)
Glucose, Bld: 207 mg/dL — ABNORMAL HIGH (ref 65–99)
Potassium: 4.1 mmol/L (ref 3.5–5.1)
SODIUM: 155 mmol/L — AB (ref 135–145)

## 2016-10-19 LAB — PROTIME-INR
INR: 1.84
Prothrombin Time: 21.5 seconds — ABNORMAL HIGH (ref 11.4–15.2)

## 2016-10-19 LAB — CBC WITH DIFFERENTIAL/PLATELET
Basophils Absolute: 0 K/uL (ref 0.0–0.1)
Basophils Relative: 0 %
Eosinophils Absolute: 0.1 K/uL (ref 0.0–0.7)
Eosinophils Relative: 1 %
HCT: 22 % — ABNORMAL LOW (ref 36.0–46.0)
Hemoglobin: 6.7 g/dL — CL (ref 12.0–15.0)
Lymphocytes Relative: 8 %
Lymphs Abs: 0.9 K/uL (ref 0.7–4.0)
MCH: 32.5 pg (ref 26.0–34.0)
MCHC: 30.5 g/dL (ref 30.0–36.0)
MCV: 106.8 fL — ABNORMAL HIGH (ref 78.0–100.0)
Monocytes Absolute: 0.5 K/uL (ref 0.1–1.0)
Monocytes Relative: 5 %
Neutro Abs: 10.1 K/uL — ABNORMAL HIGH (ref 1.7–7.7)
Neutrophils Relative %: 87 %
Platelets: 236 K/uL (ref 150–400)
RBC: 2.06 MIL/uL — ABNORMAL LOW (ref 3.87–5.11)
RDW: 16.6 % — ABNORMAL HIGH (ref 11.5–15.5)
WBC: 11.6 K/uL — ABNORMAL HIGH (ref 4.0–10.5)

## 2016-10-19 LAB — COMPREHENSIVE METABOLIC PANEL
ALT: 25 U/L (ref 14–54)
AST: 24 U/L (ref 15–41)
Albumin: 1.7 g/dL — ABNORMAL LOW (ref 3.5–5.0)
Alkaline Phosphatase: 105 U/L (ref 38–126)
BUN: 90 mg/dL — AB (ref 6–20)
CO2: 14 mmol/L — AB (ref 22–32)
CREATININE: 1.92 mg/dL — AB (ref 0.44–1.00)
Calcium: 6.9 mg/dL — ABNORMAL LOW (ref 8.9–10.3)
GFR calc Af Amer: 26 mL/min — ABNORMAL LOW (ref >60)
GFR calc non Af Amer: 23 mL/min — ABNORMAL LOW (ref >60)
GLUCOSE: 256 mg/dL — AB (ref 65–99)
Potassium: 3 mmol/L — ABNORMAL LOW (ref 3.5–5.1)
SODIUM: 154 mmol/L — AB (ref 135–145)
Total Bilirubin: 0.2 mg/dL — ABNORMAL LOW (ref 0.3–1.2)
Total Protein: 4.9 g/dL — ABNORMAL LOW (ref 6.5–8.1)

## 2016-10-19 LAB — IRON AND TIBC
IRON: 15 ug/dL — AB (ref 28–170)
Saturation Ratios: 5 % — ABNORMAL LOW (ref 10.4–31.8)
TIBC: 314 ug/dL (ref 250–450)
UIBC: 299 ug/dL

## 2016-10-19 LAB — RESPIRATORY PANEL BY PCR
Adenovirus: NOT DETECTED
BORDETELLA PERTUSSIS-RVPCR: NOT DETECTED
CHLAMYDOPHILA PNEUMONIAE-RVPPCR: NOT DETECTED
CORONAVIRUS HKU1-RVPPCR: NOT DETECTED
CORONAVIRUS NL63-RVPPCR: NOT DETECTED
Coronavirus 229E: NOT DETECTED
Coronavirus OC43: NOT DETECTED
INFLUENZA A-RVPPCR: NOT DETECTED
Influenza B: NOT DETECTED
MYCOPLASMA PNEUMONIAE-RVPPCR: NOT DETECTED
Metapneumovirus: NOT DETECTED
PARAINFLUENZA VIRUS 3-RVPPCR: NOT DETECTED
PARAINFLUENZA VIRUS 4-RVPPCR: NOT DETECTED
Parainfluenza Virus 1: NOT DETECTED
Parainfluenza Virus 2: NOT DETECTED
RHINOVIRUS / ENTEROVIRUS - RVPPCR: NOT DETECTED
Respiratory Syncytial Virus: NOT DETECTED

## 2016-10-19 LAB — CBC
HEMATOCRIT: 37.8 % (ref 36.0–46.0)
HEMOGLOBIN: 11.6 g/dL — AB (ref 12.0–15.0)
MCH: 32.5 pg (ref 26.0–34.0)
MCHC: 30.7 g/dL (ref 30.0–36.0)
MCV: 105.9 fL — ABNORMAL HIGH (ref 78.0–100.0)
Platelets: 261 10*3/uL (ref 150–400)
RBC: 3.57 MIL/uL — AB (ref 3.87–5.11)
RDW: 15.9 % — ABNORMAL HIGH (ref 11.5–15.5)
WBC: 11.6 10*3/uL — ABNORMAL HIGH (ref 4.0–10.5)

## 2016-10-19 LAB — SAVE SMEAR

## 2016-10-19 LAB — POC OCCULT BLOOD, ED: FECAL OCCULT BLD: POSITIVE — AB

## 2016-10-19 LAB — DIRECT ANTIGLOBULIN TEST (NOT AT ARMC)
DAT, COMPLEMENT: NEGATIVE
DAT, IGG: NEGATIVE

## 2016-10-19 LAB — LACTATE DEHYDROGENASE: LDH: 215 U/L — AB (ref 98–192)

## 2016-10-19 LAB — MRSA PCR SCREENING: MRSA by PCR: POSITIVE — AB

## 2016-10-19 LAB — I-STAT CG4 LACTIC ACID, ED: Lactic Acid, Venous: 1.49 mmol/L (ref 0.5–1.9)

## 2016-10-19 LAB — GLUCOSE, CAPILLARY
GLUCOSE-CAPILLARY: 151 mg/dL — AB (ref 65–99)
Glucose-Capillary: 98 mg/dL (ref 65–99)

## 2016-10-19 LAB — PROCALCITONIN: PROCALCITONIN: 1.59 ng/mL

## 2016-10-19 LAB — MAGNESIUM: MAGNESIUM: 2.9 mg/dL — AB (ref 1.7–2.4)

## 2016-10-19 LAB — INFLUENZA PANEL BY PCR (TYPE A & B)
INFLBPCR: NEGATIVE
Influenza A By PCR: NEGATIVE

## 2016-10-19 LAB — FERRITIN: Ferritin: 30 ng/mL (ref 11–307)

## 2016-10-19 LAB — CBG MONITORING, ED: GLUCOSE-CAPILLARY: 240 mg/dL — AB (ref 65–99)

## 2016-10-19 MED ORDER — FENTANYL 12 MCG/HR TD PT72
12.5000 ug | MEDICATED_PATCH | TRANSDERMAL | Status: DC
  Administered 2016-10-21 – 2016-10-23 (×2): 12.5 ug via TRANSDERMAL
  Filled 2016-10-19 (×3): qty 1

## 2016-10-19 MED ORDER — FAMOTIDINE 20 MG PO TABS
20.0000 mg | ORAL_TABLET | Freq: Every day | ORAL | Status: DC
  Administered 2016-10-19 – 2016-10-24 (×6): 20 mg via ORAL
  Filled 2016-10-19 (×6): qty 1

## 2016-10-19 MED ORDER — SODIUM CHLORIDE 0.9 % IV BOLUS (SEPSIS)
1000.0000 mL | Freq: Once | INTRAVENOUS | Status: AC
  Administered 2016-10-19: 1000 mL via INTRAVENOUS

## 2016-10-19 MED ORDER — DEXTROSE 5 % IV SOLN
1.0000 g | INTRAVENOUS | Status: DC
  Administered 2016-10-19 – 2016-10-20 (×2): 1 g via INTRAVENOUS
  Filled 2016-10-19 (×3): qty 1

## 2016-10-19 MED ORDER — INSULIN ASPART 100 UNIT/ML ~~LOC~~ SOLN
10.0000 [IU] | Freq: Once | SUBCUTANEOUS | Status: AC
  Administered 2016-10-19: 10 [IU] via SUBCUTANEOUS
  Filled 2016-10-19: qty 1

## 2016-10-19 MED ORDER — SERTRALINE HCL 100 MG PO TABS
100.0000 mg | ORAL_TABLET | Freq: Every day | ORAL | Status: DC
  Administered 2016-10-19 – 2016-10-23 (×5): 100 mg via ORAL
  Filled 2016-10-19 (×5): qty 1

## 2016-10-19 MED ORDER — SODIUM CHLORIDE 0.9 % IV SOLN
INTRAVENOUS | Status: DC
  Administered 2016-10-19: 11:00:00 via INTRAVENOUS

## 2016-10-19 MED ORDER — DEXTROSE 5 % IV SOLN
INTRAVENOUS | Status: DC
  Administered 2016-10-19 – 2016-10-20 (×4): via INTRAVENOUS

## 2016-10-19 MED ORDER — ADULT MULTIVITAMIN W/MINERALS CH
1.0000 | ORAL_TABLET | Freq: Every day | ORAL | Status: DC
  Administered 2016-10-19 – 2016-10-24 (×6): 1 via ORAL
  Filled 2016-10-19 (×6): qty 1

## 2016-10-19 MED ORDER — ATORVASTATIN CALCIUM 40 MG PO TABS
40.0000 mg | ORAL_TABLET | Freq: Every day | ORAL | Status: DC
  Administered 2016-10-19 – 2016-10-24 (×6): 40 mg via ORAL
  Filled 2016-10-19 (×6): qty 1

## 2016-10-19 MED ORDER — VANCOMYCIN HCL IN DEXTROSE 750-5 MG/150ML-% IV SOLN
750.0000 mg | INTRAVENOUS | Status: DC
  Administered 2016-10-21: 750 mg via INTRAVENOUS
  Filled 2016-10-19 (×2): qty 150

## 2016-10-19 MED ORDER — VANCOMYCIN HCL IN DEXTROSE 1-5 GM/200ML-% IV SOLN
1000.0000 mg | Freq: Once | INTRAVENOUS | Status: AC
  Administered 2016-10-19: 1000 mg via INTRAVENOUS
  Filled 2016-10-19: qty 200

## 2016-10-19 MED ORDER — HEPARIN SODIUM (PORCINE) 5000 UNIT/ML IJ SOLN
5000.0000 [IU] | Freq: Three times a day (TID) | INTRAMUSCULAR | Status: DC
  Administered 2016-10-19 – 2016-10-24 (×14): 5000 [IU] via SUBCUTANEOUS
  Filled 2016-10-19 (×15): qty 1

## 2016-10-19 MED ORDER — LEVOTHYROXINE SODIUM 25 MCG PO TABS
25.0000 ug | ORAL_TABLET | Freq: Every day | ORAL | Status: DC
  Administered 2016-10-20 – 2016-10-24 (×5): 25 ug via ORAL
  Filled 2016-10-19 (×5): qty 1

## 2016-10-19 MED ORDER — IPRATROPIUM-ALBUTEROL 0.5-2.5 (3) MG/3ML IN SOLN: 3.0000 mL | Freq: Three times a day (TID) | RESPIRATORY_TRACT | Status: DC | PRN

## 2016-10-19 MED ORDER — AMANTADINE HCL 100 MG PO CAPS
100.0000 mg | ORAL_CAPSULE | Freq: Every day | ORAL | Status: DC
  Administered 2016-10-19 – 2016-10-24 (×6): 100 mg via ORAL
  Filled 2016-10-19 (×6): qty 1

## 2016-10-19 MED ORDER — GUAIFENESIN 100 MG/5ML PO SYRP
200.0000 mg | ORAL_SOLUTION | Freq: Three times a day (TID) | ORAL | Status: DC | PRN
  Filled 2016-10-19: qty 10

## 2016-10-19 MED ORDER — FERROUS SULFATE 300 (60 FE) MG/5ML PO SYRP
300.0000 mg | ORAL_SOLUTION | Freq: Two times a day (BID) | ORAL | Status: DC
  Administered 2016-10-20 – 2016-10-24 (×9): 300 mg via ORAL
  Filled 2016-10-19 (×9): qty 5

## 2016-10-19 MED ORDER — SODIUM CHLORIDE 0.9 % IV SOLN: INTRAVENOUS | Status: DC

## 2016-10-19 MED ORDER — INSULIN ASPART 100 UNIT/ML ~~LOC~~ SOLN
0.0000 [IU] | SUBCUTANEOUS | Status: DC
  Administered 2016-10-19: 3 [IU] via SUBCUTANEOUS
  Administered 2016-10-20: 2 [IU] via SUBCUTANEOUS
  Administered 2016-10-20: 3 [IU] via SUBCUTANEOUS
  Administered 2016-10-20: 1 [IU] via SUBCUTANEOUS
  Administered 2016-10-20 (×2): 2 [IU] via SUBCUTANEOUS
  Administered 2016-10-21: 3 [IU] via SUBCUTANEOUS
  Administered 2016-10-21 (×3): 2 [IU] via SUBCUTANEOUS
  Administered 2016-10-21 – 2016-10-22 (×3): 1 [IU] via SUBCUTANEOUS
  Administered 2016-10-22: 2 [IU] via SUBCUTANEOUS
  Administered 2016-10-22: 1 [IU] via SUBCUTANEOUS
  Administered 2016-10-22: 2 [IU] via SUBCUTANEOUS
  Administered 2016-10-23: 1 [IU] via SUBCUTANEOUS
  Administered 2016-10-23: 3 [IU] via SUBCUTANEOUS
  Administered 2016-10-23: 1 [IU] via SUBCUTANEOUS
  Administered 2016-10-23: 2 [IU] via SUBCUTANEOUS
  Administered 2016-10-24 (×2): 1 [IU] via SUBCUTANEOUS
  Administered 2016-10-24: 2 [IU] via SUBCUTANEOUS

## 2016-10-19 MED ORDER — SODIUM CHLORIDE 0.9 % IV SOLN
Freq: Once | INTRAVENOUS | Status: AC
  Administered 2016-10-19: 13:00:00 via INTRAVENOUS
  Filled 2016-10-19: qty 1000

## 2016-10-19 MED ORDER — ASPIRIN 81 MG PO CHEW
81.0000 mg | CHEWABLE_TABLET | Freq: Every day | ORAL | Status: DC
  Administered 2016-10-19 – 2016-10-24 (×6): 81 mg via ORAL
  Filled 2016-10-19 (×6): qty 1

## 2016-10-19 MED ORDER — DOCUSATE SODIUM 50 MG/5ML PO LIQD
100.0000 mg | Freq: Two times a day (BID) | ORAL | Status: DC
  Administered 2016-10-19 – 2016-10-24 (×8): 100 mg via ORAL
  Filled 2016-10-19 (×8): qty 10

## 2016-10-19 NOTE — H&P (Signed)
Date: 10/19/2016               Patient Name:  Evelyn [name] MRN: [id number]  DOB: 07-18-1930 Age / Sex: 82 y.o., female   PCP: No primary care provider on file.         Medical Service: Internal Medicine Teaching Service         Attending Physician: Dr. Gust Rung, DO    First Contact: Dr. Marinda Elk Pager: [phone]  Second Contact: Dr. Loney Loh Pager: [phone]       After Hours (After 5p/  First Contact Pager: [phone]  weekends / holidays): Second Contact Pager: [phone]   Chief Complaint: AMS, increased secretion and WOB.  History of Present Illness:   82 yo F with PMH of stroke, COPD, HLD, HTN, presents with increased WOB and secretion.   Had large left MCA sroke on 9/17 s/p TPA and thrombectomy,, had resp failure during that admission, had trach and PEG. Was on zosyn x7 days. Was sent to SNF. She is nonverbal at baseline from her recent stroke. Came today from SNF with concern for increased WOB and increased secretion from her trach. She has been also less responsive from her baseline per report.   At the SNF they did CXR for concern for PNA. I don't have the result of that. She was given 1x ceftriaxone yesterday evening. They have held her PEG tube feeds since yesterday due to concern for aspiration.   I am unable to get any more hx due to patient being nonverbal. No family available at bedside.  Unclear if she was on O2 at the SNF.  Meds:  Current Meds  Medication Sig  . amantadine (SYMMETREL) 100 MG capsule Take 100 mg by mouth daily.  Marland Kitchen amLODipine (NORVASC) 10 MG tablet Take 10 mg by mouth daily.  Marland Kitchen aspirin 81 MG chewable tablet Chew 81 mg by mouth daily.  Marland Kitchen atorvastatin (LIPITOR) 40 MG tablet Take 40 mg by mouth daily.  . cefTRIAXone (ROCEPHIN) 1 g injection Inject 1 g into the muscle every 12 (twelve) hours.  . cefTRIAXone 2 g in dextrose 5 % 50 mL Inject 2 g into the vein once.  . docusate (COLACE) 50 MG/5ML liquid Take 100 mg by mouth 2 (two) times daily.   . famotidine (PEPCID) 20 MG tablet Take 20 mg by mouth daily.  . fentaNYL (DURAGESIC - DOSED MCG/HR) 12 MCG/HR Place 12.5 mcg onto the skin every 3 (three) days.  . ferrous sulfate 300 (60 Fe) MG/5ML syrup Take 300 mg by mouth 2 (two) times daily with a meal.  . guaifenesin (ROBITUSSIN) 100 MG/5ML syrup Take 200 mg by mouth every 8 (eight) hours as needed for congestion.  . hydrALAZINE (APRESOLINE) 50 MG tablet Take 50 mg by mouth 3 (three) times daily.  . insulin aspart (NOVOLOG) 100 UNIT/ML injection Inject 10 Units into the skin once.  Marland Kitchen ipratropium-albuterol (DUONEB) 0.5-2.5 (3) MG/3ML SOLN Take 3 mLs by nebulization every 8 (eight) hours as needed (wheezing).  Marland Kitchen levothyroxine (SYNTHROID, LEVOTHROID) 25 MCG tablet Take 25 mcg by mouth daily before breakfast.  . metoprolol tartrate (LOPRESSOR) 25 MG tablet Take 25 mg by mouth 2 (two) times daily.  . Multiple Vitamin (MULTIVITAMIN WITH MINERALS) TABS tablet Take 1 tablet by mouth daily.  . predniSONE (DELTASONE) 50 MG tablet Take 50 mg by mouth once.  . sertraline (ZOLOFT) 100 MG tablet Take 100 mg by mouth at bedtime.     Allergies: Allergies as  of 10/19/2016 - Review Complete 10/19/2016  Allergen Reaction Noted  . Erythromycin Itching 02/17/2016   Past Medical History:  Diagnosis Date  . Arthritis   . Cognitive communication deficit   . COPD (chronic obstructive pulmonary disease) (HCC)   . Dysphagia   . Encephalopathy   . Flaccid hemiplegia (HCC)   . Hyperlipidemia   . Hypertension   . Iron deficiency anemia   . Stroke (HCC)   . Thyroid disease     Family History: unable to obtain due to patient's mental status  Social History: unable to obtain due to patient's mental status  Review of Systems: Unable to obtain due to patient's mental status.  Physical Exam: Blood pressure 157/61, pulse 82, temperature (!) 96.6 F (35.9 C), temperature source Rectal, resp. rate 24, SpO2 98 %. Physical Exam  Constitutional:  Thin,  frail elderly female. Lying in bed. Non verbal. Has trach. Eyes closed. Does not follow any commands  HENT:  Head: Normocephalic and atraumatic.  Dry oral mucosa. Resisting mouth opening. Unable to visualize her oropharynx.   Eyes:  Resisting eye opening. Pupils are constricted but reactive to light. Unable to assess EOM.   Neck:  Trach site looks intact. No signs of infection.   Cardiovascular: Normal rate and regular rhythm.  Exam reveals no gallop and no friction rub.   No murmur heard. Respiratory:  Has trach. Normal WOB. On 3L o2.  Exam limited to anterior field, coarse breathe sounds. No crackles or wheezing.   GI: Soft. Bowel sounds are normal.  PEG tube site looks intact. No signs of infection.  Musculoskeletal: She exhibits no edema.  Moves both lower exts purposefully. Moves left upper ext to painful stimuli.  Doe snot move her right upper ext even to pain.      EKG: incomplete LBBB, LAE, QTC 498. TWI I. mild ST depression v4, v5, v6.   CXR: Tube and catheter positions as described without pneumothorax. No edema or consolidation. There is aortic atherosclerosis.  Assessment & Plan by Problem: Active Problems:   AKI (acute kidney injury) (HCC)   Hypertension   COPD (chronic obstructive pulmonary disease) (HCC)   Hyperlipidemia   Anemia   Hypernatremia   Hypokalemia  Possible Sepsis with Dyspnea and increased trach secretion Concerning for pneumonia. CXR does not show any infiltrates. She is hypothermic, has chronically elevated WBC count. - Will check flu, RSV, PCt - with her AMS, hypothermia, and borderline hypotension, I am concerned about sepsis. Will start on broad spectrum abx for now and as we get more data within next 24-48 hours, may consider tapering or d/cing them. - check blood culture. Sputum cx.   Acute on chronic macrocytic anemia Baseline 8-9, now 6.7. Unclear etiology. No clear gross source of bleeding. Could be 2/2 to worsening kidney function vs  infection.  -check FOBT - check anemia labs - save smear - check hemolysis labs (LDH, haptoglobin, COOMBS) - recheck CBC.  - consider transfusion if no sign of hemolysis  AKI likely prerenal Will replete IVF and monitor kidney function. If fails to improve, will need to do further workup including renal u/s.  Hypernatremia - likely from free water deficit -fluid resusc with normal saline for now. When fluid status corrects, will switch over to D51/2 NS or D5W. Goal is to bring down sodium slowly, no more than 8meq in 24 hours.  Hypokalemia - will replete, check Mag.  Hx of stroke - cont asa, lipito  HTN - hold amlodipine, hydral, metop  in the setting of possible sepsis.  Hypothyroidism - cont synthyroid.  COPD - cont duoneb.  Dispo: Admit patient to Inpatient with expected length of stay greater than 2 midnights.  Signed: Hyacinth Meekerasrif Voshon Petro, MD 10/19/2016, 12:14 PM  Pager: [phone]

## 2016-10-19 NOTE — Progress Notes (Signed)
Sputum induction through tracheal aspirate performed and sent to main lab.

## 2016-10-19 NOTE — ED Provider Notes (Signed)
[facility]-EM[facility]ENCY DEPT Provider Note   CSN: [id number] Arrival date & time: 10/19/16  [id number]     History   Chief Complaint No chief complaint on file.   HPI Evelyn [name] is a 82 y.o. female.  HPI Patient presents from her nursing facility with concern for increased work of breathing. Patient is nonverbal at baseline, has recent stroke, level V caveat secondary to inability to communicate. EMS reports that nursing home staff was concerned about increased work of breathing, increased secretions produced via her tracheostomy, and in spite of suctioning, with persistent dyspnea, she was sent here for evaluation. EMS reports that en route the patient had some hypotension, though this improved with fluid resuscitation.   No past medical history on file.  There are no active problems to display for this patient.   Past Surgical History:  Procedure Laterality Date  . IR GENERIC HISTORICAL  08/16/2016   IR GASTROSTOMY TUBE MOD SED 08/16/2016 Malachy MoanHeath McCullough, MD [facility]INTER[facility]  . TRACHEOSTOMY TUBE PLACEMENT N/A 08/11/2016   Procedure: TRACHEOSTOMY;  Surgeon: Drema Halonhristopher E Newman, MD;  [facility];  Service: ENT;  Laterality: N/A;    OB History    No data available       Home Medications    Prior to Admission medications   Not on File    Family History No family history on file.  Social History Social History  Substance Use Topics  . Smoking status: Not on file  . Smokeless tobacco: Not on file  . Alcohol use Not on file     Allergies   Erythromycin   Review of Systems Review of Systems  Unable to perform ROS: Patient nonverbal     Physical Exam Updated Vital Signs There were no vitals taken for this visit.  Physical Exam  Constitutional: She appears listless. She has a sickly appearance. She appears ill.  HENT:  Head: Normocephalic and atraumatic.  Eyes: Conjunctivae and EOM are normal.  Neck:    Cardiovascular: Normal rate and regular  rhythm.   Pulmonary/Chest: No stridor. She has decreased breath sounds.  Abdominal: She exhibits no distension.  Musculoskeletal: She exhibits no edema.       Arms: Neurological: She appears listless. A cranial nerve deficit is present.  Non-participatory, does not open eyes, retracts to painful stimuli is in a curled up position  Skin: Skin is warm and dry.  Psychiatric: Cognition and memory are impaired.  Nursing note and vitals reviewed.    ED Treatments / Results  Labs (all labs ordered are listed, but only abnormal results are displayed) Labs Reviewed  COMPREHENSIVE METABOLIC PANEL - Abnormal; Notable for the following:       Result Value   Sodium 154 (*)    Potassium 3.0 (*)    Chloride >130 (*)    CO2 14 (*)    Glucose, Bld 256 (*)    BUN 90 (*)    Creatinine, Ser 1.92 (*)    Calcium 6.9 (*)    Total Protein 4.9 (*)    Albumin 1.7 (*)    Total Bilirubin 0.2 (*)    GFR calc non Af Amer 23 (*)    GFR calc Af Amer 26 (*)    All other components within normal limits  CBC WITH DIFFERENTIAL/PLATELET - Abnormal; Notable for the following:    WBC 11.6 (*)    RBC 2.06 (*)    Hemoglobin 6.7 (*)    HCT 22.0 (*)    MCV 106.8 (*)  RDW 16.6 (*)    Neutro Abs 10.1 (*)    All other components within normal limits  PROTIME-INR - Abnormal; Notable for the following:    Prothrombin Time 21.5 (*)    All other components within normal limits  URINE CULTURE  CULTURE, BLOOD (ROUTINE X 2)  CULTURE, BLOOD (ROUTINE X 2)  CULTURE, EXPECTORATED SPUTUM-ASSESSMENT  RESPIRATORY PANEL BY PCR  URINALYSIS, ROUTINE W REFLEX MICROSCOPIC  CBC  CREATININE, SERUM  BASIC METABOLIC PANEL  CBC  MAGNESIUM  VITAMIN B12  FOLATE RBC  IRON AND TIBC  FERRITIN  SAVE SMEAR  LACTATE DEHYDROGENASE  HAPTOGLOBIN  INFLUENZA PANEL BY PCR (TYPE A & B, H1N1)  PROCALCITONIN  I-STAT CG4 LACTIC ACID, ED  POCT GASTRIC OCCULT BLOOD (1-CARD TO LAB)  DIRECT ANTIGLOBULIN TEST (NOT AT [facility])   Initial labs  notable for hypernatremia, hyperchloremia, acute kidney injury. Chart review notes 1 prior similar episode, about one year ago, at a different facility. On repeat exam the patient appears more calm, is now on room air, after substantial suctioning, cleaning of her tracheostomy. Helical performed, grossly negative, lab result pending. Patient received empiric fluids here, will continue to receive them appeared    EKG  EKG Interpretation  Date/Time:  Thursday October 19 2016 09:14:18 EST Ventricular Rate:  78 PR Interval:    QRS Duration: 117 QT Interval:  437 QTC Calculation: 498 R Axis:   -39 Text Interpretation:  Sinus rhythm LAE, consider biatrial enlargement Incomplete left bundle branch block LVH with secondary repolarization abnormality ST-t wave abnormality Abnormal ekg Confirmed by Gerhard Munch  MD [phone]) on 10/19/2016 9:53:01 AM Also confirmed by Gerhard Munch  MD [phone]), editor [place]  CCT, [place] ([id number])  on 10/19/2016 10:51:52 AM      EMS rhythm strip was sinus rhythm, rate 82, ST-T wave changes, artifact, abnormal  Radiology Dg Chest Port 1 View  Result Date: 10/19/2016 CLINICAL DATA:  Shortness of Breath EXAM: PORTABLE CHEST 1 VIEW COMPARISON:  August 27, 2016 FINDINGS: Tracheostomy catheter tip is 5.3 cm above the carina. The central catheter tip is in the right atrium approximately 2 cm distal to the cavoatrial junction. No pneumothorax. No edema or consolidation. Heart is upper normal in size with pulmonary vascularity within normal limits. No adenopathy. There is atherosclerotic calcification in the aorta. No bone lesions. IMPRESSION: Tube and catheter positions as described without pneumothorax. No edema or consolidation. There is aortic atherosclerosis. Electronically Signed   By: Bretta Bang III M.D.   On: 10/19/2016 09:34    Procedures Procedures (including critical care time)  Medications Ordered in ED Medications  sodium chloride 0.9 %  bolus 1,000 mL (1,000 mLs Intravenous New Bag/Given 10/19/16 1033)    And  0.9 %  sodium chloride infusion ( Intravenous New Bag/Given 10/19/16 1034)  amantadine (SYMMETREL) capsule 100 mg (not administered)  aspirin chewable tablet 81 mg (not administered)  atorvastatin (LIPITOR) tablet 40 mg (not administered)  multivitamin with minerals tablet 1 tablet (not administered)  famotidine (PEPCID) tablet 20 mg (not administered)  levothyroxine (SYNTHROID, LEVOTHROID) tablet 25 mcg (not administered)  sertraline (ZOLOFT) tablet 100 mg (not administered)  ferrous sulfate 300 (60 Fe) MG/5ML syrup 300 mg (not administered)  ipratropium-albuterol (DUONEB) 0.5-2.5 (3) MG/3ML nebulizer solution 3 mL (not administered)  guaifenesin (ROBITUSSIN) 100 MG/5ML syrup 200 mg (not administered)  fentaNYL (DURAGESIC - dosed mcg/hr) 12.5 mcg (not administered)  insulin aspart (novoLOG) injection 10 Units (not administered)  docusate (COLACE) 50 MG/5ML liquid 100 mg (not  administered)  heparin injection 5,000 Units (not administered)  sodium chloride 0.9 % 1,000 mL with potassium chloride 80 mEq infusion (not administered)     Initial Impression / Assessment and Plan / ED Course  I have reviewed the triage vital signs and the nursing notes.  Pertinent labs & imaging results that were available during my care of the patient were reviewed by me and considered in my medical decision making (see chart for details).  Clinical Course     Patient presents from nursing facility with staff concerns of decreased interactivity, dyspnea. Notably, the patient has multiple medical issues including prior stroke, and at baseline blind, nonverbal. Here the patient is ill-appearing, has obvious secretions about her tracheostomy. After these are removed, the patient's work of breathing improves substantially. Mentation does not improve appreciably, this is likely due at least in part to the patient's comorbidities as well  as substantial electrolyte abnormalities including critical abnormal sodium and chloride values in addition to acute kidney injury. Patient received continuous of fluid resuscitation, required admission to the stepdown unit for further evaluation and management.  Final Clinical Impressions(s) / ED Diagnoses  Acute kidney injury Hyperkalemia Hyperchloremia Altered mental status   CRITICAL CARE Performed by: Gerhard MunchLOCKWOOD, Leoda Smithhart Total critical care time: 45 minutes Critical care time was exclusive of separately billable procedures and treating other patients. Critical care was necessary to treat or prevent imminent or life-threatening deterioration. Critical care was time spent personally by me on the following activities: development of treatment plan with patient and/or surrogate as well as nursing, discussions with consultants, evaluation of patient's response to treatment, examination of patient, obtaining history from patient or surrogate, ordering and performing treatments and interventions, ordering and review of laboratory studies, ordering and review of radiographic studies, pulse oximetry and re-evaluation of patient's condition.    Gerhard Munchobert Eaton Folmar, MD 10/19/16 1145

## 2016-10-19 NOTE — Progress Notes (Signed)
MD contacted for order to use PEG meds & order for I/O for urine samples since patient incontinent. New orders received. MD clarified that hgb reading of 6 was lab error, ok to wait to repeat CBC in am.

## 2016-10-19 NOTE — Progress Notes (Signed)
[facility]. CSW following for disposition and return to SNF when medically stable and cleared for discharge.    Enos FlingAshley Canio Winokur, MSW, LCSW [facility]/58M Clinical Social Worker [phone]

## 2016-10-19 NOTE — ED Notes (Signed)
Trach care performed, thick tan mucus removed from trach, O2 device, new ties applied.

## 2016-10-19 NOTE — ED Notes (Signed)
Pt incontinent of urine, bed linens and clothing changed.

## 2016-10-19 NOTE — Progress Notes (Signed)
Pharmacy Antibiotic Note  Evelyn [name] is a 82 y.o. female admitted from SNF on 10/19/2016 with sepsis. Creatinine 2.42 (baseline ~1.0). No weight collected yet; weight per CareEverywhere on 07/28/16 is 50.9 kg. IBW is 45.5 kg. WBC and procalcitonin elevated, lactate normal, and hypothermic to 96.18F. Pharmacy has been consulted for vancomycin and cefepime dosing.  Plan: Give vancomycin 1g loading dose stat Start Vancomycin 750mg  IV q48h Start Cefepime 1g IV q24h Monitor c/s, clinical status, LOT, renal function, vanc trough as clinically indicated, and ability to de-escalate  Temp (24hrs), Avg:96.6 F (35.9 C), Min:96.6 F (35.9 C), Max:96.6 F (35.9 C)   Recent Labs Lab 10/19/16 0914 10/19/16 0950  WBC 11.6*  --   CREATININE 1.92*  --   LATICACIDVEN  --  1.49    CrCl cannot be calculated (Unknown ideal weight.).    Allergies  Allergen Reactions  . Erythromycin Itching    Antimicrobials this admission: Vancomycin 12/21 >>  Cefepime 12/21 >>   Dose adjustments this admission:  Microbiology results: 12/21 BCx: Sent 12/21 RVP: Pending 12/21 Flu: Pending  Thank you for allowing pharmacy to be a part of this patient's care.  Alfredo BachJoseph Jasmynn Pfalzgraf, Cleotis NipperBS, PharmD Clinical Pharmacy Resident [phone]) 10/19/2016 1:26 PM

## 2016-10-19 NOTE — ED Triage Notes (Signed)
[facility] with report of increased sputum x 2 days.  Pt has trach, per EMS, staff has suctioned large amount of brown/tan phlegm.  PICC line to R upper arm was placed yesterday for prophylactic abx for possible aspiration pneumonia, EMS reports tube feedings were stopped yesterday.

## 2016-10-20 DIAGNOSIS — Z7189 Other specified counseling: Secondary | ICD-10-CM

## 2016-10-20 DIAGNOSIS — E870 Hyperosmolality and hypernatremia: Secondary | ICD-10-CM

## 2016-10-20 DIAGNOSIS — Z515 Encounter for palliative care: Secondary | ICD-10-CM

## 2016-10-20 DIAGNOSIS — Z7401 Bed confinement status: Secondary | ICD-10-CM

## 2016-10-20 DIAGNOSIS — R7881 Bacteremia: Secondary | ICD-10-CM

## 2016-10-20 DIAGNOSIS — J9611 Chronic respiratory failure with hypoxia: Secondary | ICD-10-CM

## 2016-10-20 LAB — BASIC METABOLIC PANEL
Anion gap: 4 — ABNORMAL LOW (ref 5–15)
Anion gap: 5 (ref 5–15)
BUN: 65 mg/dL — AB (ref 6–20)
BUN: 73 mg/dL — AB (ref 6–20)
BUN: 81 mg/dL — AB (ref 6–20)
BUN: 57 mg/dL — ABNORMAL HIGH (ref 6–20)
CHLORIDE: 126 mmol/L — AB (ref 101–111)
CHLORIDE: 122 mmol/L — AB (ref 101–111)
CO2: 19 mmol/L — AB (ref 22–32)
CO2: 19 mmol/L — AB (ref 22–32)
CO2: 20 mmol/L — AB (ref 22–32)
CO2: 21 mmol/L — AB (ref 22–32)
CREATININE: 1.9 mg/dL — AB (ref 0.44–1.00)
CREATININE: 1.94 mg/dL — AB (ref 0.44–1.00)
Calcium: 8.8 mg/dL — ABNORMAL LOW (ref 8.9–10.3)
Calcium: 9 mg/dL (ref 8.9–10.3)
Calcium: 9 mg/dL (ref 8.9–10.3)
Calcium: 9.2 mg/dL (ref 8.9–10.3)
Creatinine, Ser: 1.87 mg/dL — ABNORMAL HIGH (ref 0.44–1.00)
Creatinine, Ser: 1.66 mg/dL — ABNORMAL HIGH (ref 0.44–1.00)
GFR calc Af Amer: 26 mL/min — ABNORMAL LOW (ref >60)
GFR calc Af Amer: 27 mL/min — ABNORMAL LOW (ref >60)
GFR calc non Af Amer: 23 mL/min — ABNORMAL LOW (ref >60)
GFR calc non Af Amer: 23 mL/min — ABNORMAL LOW (ref >60)
GFR calc non Af Amer: 24 mL/min — ABNORMAL LOW (ref >60)
GFR calc non Af Amer: 27 mL/min — ABNORMAL LOW (ref >60)
GFR, EST AFRICAN AMERICAN: 27 mL/min — AB (ref >60)
GFR, EST AFRICAN AMERICAN: 32 mL/min — AB (ref >60)
GLUCOSE: 213 mg/dL — AB (ref 65–99)
Glucose, Bld: 84 mg/dL (ref 65–99)
Glucose, Bld: 218 mg/dL — ABNORMAL HIGH (ref 65–99)
Glucose, Bld: 164 mg/dL — ABNORMAL HIGH (ref 65–99)
POTASSIUM: 4.5 mmol/L (ref 3.5–5.1)
POTASSIUM: 4.8 mmol/L (ref 3.5–5.1)
POTASSIUM: 4.1 mmol/L (ref 3.5–5.1)
POTASSIUM: 4.2 mmol/L (ref 3.5–5.1)
SODIUM: 149 mmol/L — AB (ref 135–145)
SODIUM: 146 mmol/L — AB (ref 135–145)
Sodium: 154 mmol/L — ABNORMAL HIGH (ref 135–145)
Sodium: 156 mmol/L — ABNORMAL HIGH (ref 135–145)

## 2016-10-20 LAB — GLUCOSE, CAPILLARY
GLUCOSE-CAPILLARY: 143 mg/dL — AB (ref 65–99)
GLUCOSE-CAPILLARY: 158 mg/dL — AB (ref 65–99)
GLUCOSE-CAPILLARY: 220 mg/dL — AB (ref 65–99)
Glucose-Capillary: 168 mg/dL — ABNORMAL HIGH (ref 65–99)
Glucose-Capillary: 97 mg/dL (ref 65–99)
Glucose-Capillary: 176 mg/dL — ABNORMAL HIGH (ref 65–99)

## 2016-10-20 LAB — FOLATE RBC
FOLATE, HEMOLYSATE: 540.9 ng/mL
Folate, RBC: 1276 ng/mL (ref >498)
HEMATOCRIT: 42.4 % (ref 34.0–46.6)

## 2016-10-20 LAB — HAPTOGLOBIN: Haptoglobin: 439 mg/dL — ABNORMAL HIGH (ref 34–200)

## 2016-10-20 MED ORDER — GLUCERNA 1.2 CAL PO LIQD
1000.0000 mL | ORAL | Status: DC
  Administered 2016-10-20 – 2016-10-23 (×2): 1000 mL
  Filled 2016-10-20 (×6): qty 1000

## 2016-10-20 MED ORDER — JEVITY 1.2 CAL PO LIQD: 1000.0000 mL | ORAL | Status: DC

## 2016-10-20 NOTE — Progress Notes (Signed)
CRITICAL VALUE ALERT  Critical value received:  Chloride >130  Date of notification:  10/20/2016   Time of notification:  12:57 AM   Critical value read back: yes  Nurse who received alert:  Birdena CrandallAmanda Idan Prime RN  MD notified (1st page):   Time of first page:  12:58 AM   MD notified (2nd page):  Time of second page:  Responding MD:  Obie DredgeBlum  Time MD responded:  [phone]

## 2016-10-20 NOTE — Progress Notes (Signed)
   Subjective:   Patient remains nonverbal. She is more alert today and follow some simple commands. Vitals stable overnight.   Objective:  Vital signs in last 24 hours: Vitals:   10/20/16 0340 10/20/16 0400 10/20/16 0732 10/20/16 0841  BP: 132/67  (!) 97/45   Pulse:   78 73  Resp: 20  (!) 21 18  Temp: 99.1 F (37.3 C)  98.4 F (36.9 C)   TempSrc: Oral  Oral   SpO2: 100% 100% 100% 100%  Weight:      Height:       Vitals reviewed. General:thin, frail elderly female, resting in bed, nonverbal, opens her eyes today and follows simple commands.  HEENT: dry oral mucosa, pupils constricted but reactive to light, could not assess full EOM, no scleral icterus Cardiac: RRR, no rubs, murmurs or gallops Pulm: on 2L o2 via trach collar. Trach collar site appears intact. Normal WOB. Coarse breathe sounds, no crackles or wheezes.  Abd: soft, nontender, nondistended, BS present. PEG tube site appears intact.  Ext: warm and well perfused, no pedal edema Neuro: more alert, nonverbal. Moves both lower ext purposefully, moves left upper ext to painful stimuli, does not move RUE to even pain.    Assessment/Plan:  Active Problems:   AKI (acute kidney injury) (HCC)   Hypertension   COPD (chronic obstructive pulmonary disease) (HCC)   Hyperlipidemia   Anemia   Hypernatremia   Hypokalemia   Chronic respiratory failure with hypoxia (HCC)   Status post tracheostomy (HCC)   Gastrostomy tube dependent (HCC)  Possible Sepsis likely 2/2 to PNA Had less responsiveness, Dyspnea and increased trach secretion Concerning for pneumonia. However, CXR does not show any infiltrates. Flu negative. Resp viral panel neg. PCT elevated 1.59.  bcx pending, sputum cx rare budding yest and rare GPC in pairs - cont broad spectrum abx for now as she is showing sign of improvement with abx. . - f/up blood and sputum cx. - narrow down abx as appropriate tomorrow  AKI likely prerenal - improved with fluids.  Cont  fluids. Follow BMET  Hypernatremia - likely from free water deficit -s/p fluid resusc with normal saline initially, now on D5W.  Goal is to bring down sodium slowly, no more than 8meq in 24 hours. Currently 154.  - f/up bmet q8hr.   Hypokalemia - repleted.   Hx of stroke now bed bound, nonverbal with PEG tube dependance-  - consulted palliative care to help with GOC discussion. [name] wants her to remain full code. - cont asa, lipito  HTN - hold amlodipine, hydral, metop in the setting of possible sepsis.  Hypothyroidism - cont synthyroid.  COPD - cont duoneb.  Dispo: Anticipated discharge in approximately 1-2 day(s).   Hyacinth Meekerasrif Enyah Moman, MD 10/20/2016, 10:05 AM Pager: [phone]

## 2016-10-20 NOTE — Progress Notes (Signed)
Received call from microbiology lab in regards to sputum culture sent earlier in the evening. Coding of order changed to non-expectorated per request so that specimen could be processed.

## 2016-10-20 NOTE — Progress Notes (Signed)
Internal Medicine Attending:   I saw and examined the patient. I reviewed Dr Marcelino FreestoneAhmed's note and I agree with the resident's findings and plan as documented in the resident's note. Patient appears to be clinically improving.  Today she is responding to voice with eye opening and appears to follow simple commands.  On exam she is in no respiratory distress and lungs are grossly clear to ausculation.  Lips are dry, I am unable to preform a good oral exam due to patient cooperation.  She does lightly squeeze my hand with her left hand. I am a little unclear of her baseline but this may be similar.  Interval events as noted in Dr Marcelino FreestoneAhmed's note include negative respiratory virus panel, elevated procalcintonin, MRSA nasal swab positive, sputum culture with yeast and GPC in pairs.  I agree with Vanc and Cefepime continuation for today, we will look to repeat procalcintonin tomorrow and follow culture data with likely narrowing on antibiotics. Her hypernatremia has not yet improved, this is not surprising given her need for NS bolus and IV potassium yesterday,  We will now work to improve her hypernatremia with increased free water, initially by D5W however we may also start some additional  free water per tube (TF as ordered contains 869ml free water per Dietician). In addition we have consulted palliative care to help us with GOC discussions with family.

## 2016-10-20 NOTE — Consult Note (Signed)
Consultation Note Date: 10/20/2016   Patient Name: Evelyn [name]  DOB: 1930-02-14  MRN: [id number]  Age / Sex: 82 y.o., female  PCP: No primary care provider on file. Referring Physician: Gust RungErik C Hoffman, DO  Reason for Consultation: Establishing goals of care  HPI/Patient Profile: 82 y.o. female  with past medical history of CVA in 06/2016 with residual dysphagia, aphasia, and weakness, COPD, G2 diastolic dysfunction, who is status post Trach and PEG, was admitted on 10/19/2016 from SNF with AKI, hypernatremia, and possible pneumonia.  SNF indicated she had increased secretions and work of breathing.  Tube feeds had been discontinued prior to admission for concern of aspiration.  After reading thru her EPIC records, it appears that in the past Evelyn [name] has received most of her care at [facility]  Clinical Assessment and Goals of Care: I spoke with the patient's son Evelyn [name] over the phone.  [place]  The patient has [place] [address] [place].  Evelyn [name] and the patient's nephew, Evelyn [name], live together and make health care decisions for the patient.  Prior to her stroke in September she used to live with Evelyn Hillonald and Evelyn [name].  Evelyn [name] mentions that his mother never discussed dying prior to her stroke.  Now if they mention her passing she will give them a "dirty look".  We discussed the fact that his mother was critically ill in September and again on this admission - she could have easily passed.  Donald agreed, but seemed uneducated about his mothers current condition.  We discussed code status.  Donald requested that resuscitation be attempted if she died.   I asked him how will he know when it is time to let her pass - He replied "We just don't even consider that".  Evelyn [name] was pleasant and open to meeting in the future.  Primary Decision Maker:  NEXT OF  KIN [name] and nephew    SUMMARY OF RECOMMENDATIONS    Evelyn Hillonald and Evelyn [name] need multiple conversations and education regarding aspiration pneumonia, EOL, and their mother's trajectory. At this point their desire is for her to remain a full code.  Code Status/Advance Care Planning:  Full code    Symptom Management:   Per primary team.  Palliative Prophylaxis:   Aspiration  Prognosis:   < 6 months given significant stroke, bed bound status, trach, aspiration pneumonia  Discharge Planning: Skilled Nursing Facility for rehab with Palliative care service follow-up      Primary Diagnoses: Present on Admission: . AKI (acute kidney injury) (HCC) . Chronic respiratory failure with hypoxia (HCC)   I have reviewed the medical record, interviewed the patient and family, and examined the patient. The following aspects are pertinent.  Past Medical History:  Diagnosis Date  . Arthritis   . Cognitive communication deficit   . COPD (chronic obstructive pulmonary disease) (HCC)   . Dysphagia   . Encephalopathy   . Flaccid hemiplegia (HCC)   . Hyperlipidemia   .  Hypertension   . Iron deficiency anemia   . Stroke (HCC)   . Thyroid disease    Social History   Social History  . Marital status: Widowed    Spouse name: N/A  . Number of children: N/A  . Years of education: N/A   Social History Main Topics  . Smoking status: Unknown If Ever Smoked  . Smokeless tobacco: None  . Alcohol use None  . Drug use: Unknown  . Sexual activity: Not Asked   Other Topics Concern  . None   Social History Narrative  . None   History reviewed. No pertinent family history. Scheduled Meds: . amantadine  100 mg Oral Daily  . aspirin  81 mg Oral Daily  . atorvastatin  40 mg Oral Daily  . ceFEPime (MAXIPIME) IV  1 g Intravenous Q24H  . docusate  100 mg Oral BID  . famotidine  20 mg Oral Daily  . fentaNYL  12.5 mcg Transdermal Q72H  . ferrous sulfate  300 mg Oral BID WC  .  heparin  5,000 Units Subcutaneous Q8H  . insulin aspart  0-9 Units Subcutaneous Q4H  . levothyroxine  25 mcg Oral QAC breakfast  . multivitamin with minerals  1 tablet Oral Daily  . sertraline  100 mg Oral QHS  . [START ON 10/21/2016] vancomycin  750 mg Intravenous Q48H   Continuous Infusions: . dextrose 125 mL/hr at 10/20/16 0610  . feeding supplement (GLUCERNA 1.2 CAL)     PRN Meds:.guaifenesin, ipratropium-albuterol Allergies  Allergen Reactions  . Erythromycin Itching   Review of Systems patient unable.  Physical Exam  Constitutional:  Frail, thin, female.  Eyes tightly shut.  On the vent  HENT:  Head: Normocephalic and atraumatic.  Neck: Neck supple.  Tracheostomy in place w/o drainage.  Cardiovascular: Normal rate and normal heart sounds.   Pulmonary/Chest: Effort normal.  On vent.  NAD  Abdominal: Soft.  PEG in place without drainage  Musculoskeletal: She exhibits no edema or deformity.  Able to move both lower ext.  R>L.  Would not unclench right hand.  Neurological:  Eyes shut, non verbal.  Reflexes in tact  Skin: Skin is warm and dry.  Psychiatric:  Unable to assess  Nursing note and vitals reviewed.    Vital Signs: BP (!) 113/58   Pulse 75   Temp 97.8 F (36.6 C) (Oral)   Resp 14   Ht 5' (1.524 m)   Wt 41.9 kg (92 lb 6 oz)   SpO2 100%   BMI 18.04 kg/m  Pain Assessment: CPOT       SpO2: SpO2: 100 % O2 Device:SpO2: 100 % O2 Flow Rate: .O2 Flow Rate (L/min): 6 L/min  IO: Intake/output summary:  Intake/Output Summary (Last 24 hours) at 10/20/16 1529 Last data filed at 10/20/16 0600  Gross per 24 hour  Intake           1687.5 ml  Output              600 ml  Net           1087.5 ml    LBM: Last BM Date: 10/18/16 Baseline Weight: Weight: 41.9 kg (92 lb 6 oz) Most recent weight: Weight: 41.9 kg (92 lb 6 oz)     Palliative Assessment/Data:   Flowsheet Rows   Flowsheet Row Most Recent Value  Intake Tab  Unit at Time of Referral   Cardiac/Telemetry Unit  Palliative Care Primary Diagnosis  Neurology  Date Notified  10/20/16  Palliative Care Type  New Palliative care  Reason for referral  Clarify Goals of Care  Date of Admission  10/19/16  Date first seen by Palliative Care  10/20/16  # of days Palliative referral response time  0 Day(s)  # of days IP prior to Palliative referral  1  Clinical Assessment  Palliative Performance Scale Score  30%  Psychosocial & Spiritual Assessment  Palliative Care Outcomes  Patient/Family meeting held?  Yes  Who was at the meeting?  patient and son Evelyn Hill(Donald on the phone)  Palliative Care Outcomes  Clarified goals of care      Time In: 3:00 Time Out: 4:10 Time Total: 70 m Greater than 50%  of this time was spent counseling and coordinating care related to the above assessment and plan.  Signed by: Algis DownsMarianne York, PA-C Palliative Medicine Pager: [phone]  Please contact Palliative Medicine Team phone at [phone] for questions and concerns.  For individual provider: See Loretha StaplerAmion

## 2016-10-20 NOTE — Progress Notes (Signed)
Initial Nutrition Assessment  DOCUMENTATION CODES:   Underweight, Non-severe (moderate) malnutrition in context of chronic illness  INTERVENTION:    Start TF via PEG with Glucerna 1.2 at 25 ml/h, increase by 10 ml every 4 hours to goal rate of 45 ml/h to provide 1296 kcals, 65 gm protein, 869 ml free water daily.  NUTRITION DIAGNOSIS:   Malnutrition related to chronic illness as evidenced by mild depletion of muscle mass, mild depletion of body fat.  GOAL:   Patient will meet greater than or equal to 90% of their needs  MONITOR:   Labs, TF tolerance, Skin, I & O's  REASON FOR ASSESSMENT:   Consult Enteral/tube feeding initiation and management  ASSESSMENT:   82 yo F with PMH of stroke, COPD, HLD, HTN, who presented to the hospital from SNF on 12/21 with increased WOB and increased secretions from her trach. She has been nonverbal with trach and PEG since stroke in September.  Nutrition-Focused physical exam completed. Findings are mild fat depletion, mild muscle depletion, and no edema.  [facility] due to concern for aspiration. Unsure of usual TF regimen. Received MD Consult for TF initiation and management. Labs reviewed: sodium, BUN & creatinine are elevated. Medications reviewed and include ferrous sulfate, colace, MVI.  Diet Order:   NPO  Skin:  Reviewed, no issues  Last BM:  12/20  Height:   Ht Readings from Last 1 Encounters:  10/19/16 5' (1.524 m)    Weight:   Wt Readings from Last 1 Encounters:  10/19/16 92 lb 6 oz (41.9 kg)    Ideal Body Weight:  45.5 kg  BMI:  Body mass index is 18.04 kg/m.  Estimated Nutritional Needs:   Kcal:  1200-1400  Protein:  60-80 gm  Fluid:  1.4 L  EDUCATION NEEDS:   No education needs identified at this time  Joaquin CourtsKimberly Amine Adelson, RD, LDN, CNSC Pager [phone] After Hours Pager [phone]

## 2016-10-20 NOTE — NC FL2 (Signed)
[place] MEDICAID FL2 LEVEL OF CARE SCREENING TOOL     IDENTIFICATION  Patient Name: Evelyn [name] Birthdate: [birth year] Sex: female Admission Date (Current Location): 10/19/2016  [facility] [place]   Facility and Address:  [facility] [place], [address], [place], [place] [address]      Provider Number: [id number]  Attending Physician Name and Address:  Gust RungErik C Hoffman, DO  Relative Name and Phone Number:       Current Level of Care: Hospital Recommended Level of Care: Skilled Nursing Facility Prior Approval Number:    Date Approved/Denied:   PASRR Number:    Discharge Plan: SNF    Current Diagnoses: Patient Active Problem List   Diagnosis Date Noted  . AKI (acute kidney injury) (HCC) 10/19/2016  . Hypertension 10/19/2016  . COPD (chronic obstructive pulmonary disease) (HCC) 10/19/2016  . Hyperlipidemia 10/19/2016  . Anemia 10/19/2016  . Hypernatremia 10/19/2016  . Hypokalemia 10/19/2016  . Chronic respiratory failure with hypoxia (HCC) 10/19/2016  . Status post tracheostomy (HCC) 10/19/2016  . Gastrostomy tube dependent (HCC) 10/19/2016    Orientation RESPIRATION BLADDER Height & Weight        Tracheostomy, O2 (28% FiO2) Incontinent Weight: 92 lb 6 oz (41.9 kg) Height:  5' (152.4 cm)  BEHAVIORAL SYMPTOMS/MOOD NEUROLOGICAL BOWEL NUTRITION STATUS      Incontinent Feeding tube  AMBULATORY STATUS COMMUNICATION OF NEEDS Skin   Total Care Does not communicate Normal                       Personal Care Assistance Level of Assistance  Bathing, Dressing, Feeding Bathing Assistance: Maximum assistance Feeding assistance: Maximum assistance (PEG) Dressing Assistance: Maximum assistance     Functional Limitations Info             SPECIAL CARE FACTORS FREQUENCY  PT (By licensed PT), OT (By licensed OT)     PT Frequency: 5/wk OT Frequency: 5/wk            Contractures      Additional Factors Info   Code Status, Allergies, Isolation Precautions Code Status Info: FULL Allergies Info: Erythromycin     Isolation Precautions Info: MRSA     Current Medications (10/20/2016):  This is the current hospital active medication list Current Facility-Administered Medications  Medication Dose Route Frequency Provider Last Rate Last Dose  . amantadine (SYMMETREL) capsule 100 mg  100 mg Oral Daily Tasrif Ahmed, MD   100 mg at 10/20/16 0954  . aspirin chewable tablet 81 mg  81 mg Oral Daily Tasrif Ahmed, MD   81 mg at 10/20/16 0954  . atorvastatin (LIPITOR) tablet 40 mg  40 mg Oral Daily Tasrif Ahmed, MD   40 mg at 10/20/16 0954  . ceFEPIme (MAXIPIME) 1 g in dextrose 5 % 50 mL IVPB  1 g Intravenous Q24H Gust RungErik C Hoffman, DO   Stopped at 10/19/16 1400  . dextrose 5 % solution   Intravenous Continuous John GiovanniVasundhra Rathore, MD 125 mL/hr at 10/20/16 0610    . docusate (COLACE) 50 MG/5ML liquid 100 mg  100 mg Oral BID Hyacinth Meekerasrif Ahmed, MD   100 mg at 10/20/16 0953  . famotidine (PEPCID) tablet 20 mg  20 mg Oral Daily Tasrif Ahmed, MD   20 mg at 10/20/16 0954  . feeding supplement (GLUCERNA 1.2 CAL) liquid 1,000 mL  1,000 mL Per Tube Continuous Tasrif Ahmed, MD      . fentaNYL (DURAGESIC - dosed  mcg/hr) 12.5 mcg  12.5 mcg Transdermal Q72H Tasrif Ahmed, MD      . ferrous sulfate 300 (60 Fe) MG/5ML syrup 300 mg  300 mg Oral BID WC Tasrif Ahmed, MD   300 mg at 10/20/16 0953  . guaifenesin (ROBITUSSIN) 100 MG/5ML syrup 200 mg  200 mg Oral Q8H PRN Tasrif Ahmed, MD      . heparin injection 5,000 Units  5,000 Units Subcutaneous Q8H Tasrif Ahmed, MD   5,000 Units at 10/20/16 0600  . insulin aspart (novoLOG) injection 0-9 Units  0-9 Units Subcutaneous Q4H Tasrif Ahmed, MD   2 Units at 10/20/16 0359  . ipratropium-albuterol (DUONEB) 0.5-2.5 (3) MG/3ML nebulizer solution 3 mL  3 mL Nebulization Q8H PRN Tasrif Ahmed, MD      . levothyroxine (SYNTHROID, LEVOTHROID) tablet 25 mcg  25 mcg Oral QAC breakfast Hyacinth Meekerasrif Ahmed, MD   25  mcg at 10/20/16 0954  . multivitamin with minerals tablet 1 tablet  1 tablet Oral Daily Tasrif Ahmed, MD   1 tablet at 10/20/16 0954  . sertraline (ZOLOFT) tablet 100 mg  100 mg Oral QHS Tasrif Ahmed, MD   100 mg at 10/19/16 2119  . [START ON 10/21/2016] vancomycin (VANCOCIN) IVPB 750 mg/150 ml premix  750 mg Intravenous Q48H Gust RungErik C Hoffman, DO         Discharge Medications: Please see discharge summary for a list of discharge medications.  Relevant Imaging Results:  Relevant Lab Results:   Additional Information    Burna SisUris, Seriah Brotzman H, LCSW

## 2016-10-21 ENCOUNTER — Inpatient Hospital Stay (HOSPITAL_COMMUNITY): Payer: Medicare Other

## 2016-10-21 LAB — PROCALCITONIN: PROCALCITONIN: 0.99 ng/mL

## 2016-10-21 LAB — BASIC METABOLIC PANEL
Anion gap: 7 (ref 5–15)
Anion gap: 5 (ref 5–15)
BUN: 52 mg/dL — AB (ref 6–20)
BUN: 42 mg/dL — AB (ref 6–20)
CALCIUM: 9.2 mg/dL (ref 8.9–10.3)
CALCIUM: 8.3 mg/dL — AB (ref 8.9–10.3)
CO2: 19 mmol/L — ABNORMAL LOW (ref 22–32)
CO2: 18 mmol/L — ABNORMAL LOW (ref 22–32)
CREATININE: 1.59 mg/dL — AB (ref 0.44–1.00)
Chloride: 119 mmol/L — ABNORMAL HIGH (ref 101–111)
Chloride: 118 mmol/L — ABNORMAL HIGH (ref 101–111)
Creatinine, Ser: 1.4 mg/dL — ABNORMAL HIGH (ref 0.44–1.00)
GFR calc Af Amer: 33 mL/min — ABNORMAL LOW (ref >60)
GFR calc Af Amer: 39 mL/min — ABNORMAL LOW (ref >60)
GFR, EST NON AFRICAN AMERICAN: 29 mL/min — AB (ref >60)
GFR, EST NON AFRICAN AMERICAN: 33 mL/min — AB (ref >60)
GLUCOSE: 397 mg/dL — AB (ref 65–99)
Glucose, Bld: 177 mg/dL — ABNORMAL HIGH (ref 65–99)
Potassium: 5.1 mmol/L (ref 3.5–5.1)
Potassium: 4.3 mmol/L (ref 3.5–5.1)
SODIUM: 145 mmol/L (ref 135–145)
Sodium: 141 mmol/L (ref 135–145)

## 2016-10-21 LAB — GLUCOSE, CAPILLARY
GLUCOSE-CAPILLARY: 146 mg/dL — AB (ref 65–99)
GLUCOSE-CAPILLARY: 171 mg/dL — AB (ref 65–99)
GLUCOSE-CAPILLARY: 178 mg/dL — AB (ref 65–99)
GLUCOSE-CAPILLARY: 226 mg/dL — AB (ref 65–99)
Glucose-Capillary: 123 mg/dL — ABNORMAL HIGH (ref 65–99)
Glucose-Capillary: 123 mg/dL — ABNORMAL HIGH (ref 65–99)
Glucose-Capillary: 159 mg/dL — ABNORMAL HIGH (ref 65–99)

## 2016-10-21 LAB — URINE CULTURE
Culture: >=100000 — AB
Special Requests: NORMAL

## 2016-10-21 MED ORDER — DEXTROSE-NACL 5-0.45 % IV SOLN
INTRAVENOUS | Status: AC
  Administered 2016-10-21: 11:00:00 via INTRAVENOUS

## 2016-10-21 MED ORDER — CEFTRIAXONE SODIUM 1 G IJ SOLR
1.0000 g | INTRAMUSCULAR | Status: DC
  Administered 2016-10-21 – 2016-10-24 (×4): 1 g via INTRAVENOUS
  Filled 2016-10-21 (×4): qty 10

## 2016-10-21 NOTE — Plan of Care (Signed)
Problem: Discharge Progression Outcomes Goal: Dyspnea controlled Outcome: Progressing Patient RR stable Goal: O2 sats > or equal 90% or at baseline Outcome: Progressing Patient sats above 95 Goal: Home O2 if indicated Outcome: Completed/Met Date Met: 10/21/16 Patient will dc to SNF with O2 available Goal: Able to self administer respiratory meds Outcome: Not Applicable Date Met: 12/07/89 Patient unable to administer own medications  Problem: Education: Goal: Knowledge of about tracheostomy will improve Outcome: Not Met (add Reason) Patient non-verbal and non-interactive. Unsure of how much patient understands when speaking to her.  Problem: Coping: Goal: Level of anxiety will decrease Outcome: Progressing Patient appears to be calm, not anxious

## 2016-10-21 NOTE — Progress Notes (Signed)
Internal Medicine Attending:   I saw and examined the patient. I reviewed the resident's note and I agree with the resident's findings and plan as documented in the resident's note. Patient continues to have some clincial improvement.  Today on exam her eyes are open when I entered the room, she does appear to follow some of what I am saying and does track me well across the room.  Her right eye is sclerotic.  She does follow commands with squeezing via her left hand, she did not move her right hand for me or wiggle her toes.  Her lungs on my exam were grossly clear and she has no pedal edema.  Heart rate is regular.  Per nursing her respiratory secretions have decreased.  Thus far her Blood cultures are negative and her respiratory showed rare gram positive cocci and rare yeast.  Her procalcitonin is lower than previous.  Clincally she is responding well to IV hydration and empirirc antibotics.  Given her suptum shows gram positive cocci we will deescalate cefepime to ceftriaxone and continue vancomycin (especially given positive MRSA nasal swab).  We will attempt to repeat a CXR today.  Given her level of care needed we will keep in the step down, possible discharge Sunday versus Monday to her SNF.

## 2016-10-21 NOTE — Progress Notes (Signed)
Pharmacy Antibiotic Note  Evelyn [name] is a 82 y.o. female admitted from SNF on 10/19/2016 with sepsis/pneumonia. Received ceftriaxone 2g IV x1 on 12/20 and 1g on 12/21 at nursing home.  With AKI, but improving. SCr currently 1.59, CrCl ~15-2720mL/min  Plan: -Vancomycin 750mg  IV q48h (next dose due today) -Ceftriaxone 1g IV q24h -Monitor c/s, clinical status, LOT, renal function, vanc trough as clinically indicated, and ability to de-escalate  Temp (24hrs), Avg:98.4 F (36.9 C), Min:97.9 F (36.6 C), Max:98.8 F (37.1 C)   Recent Labs Lab 10/19/16 0914 10/19/16 0950 10/19/16 1139  10/20/16 0005 10/20/16 0615 10/20/16 1315 10/20/16 1800 10/21/16 0535  WBC 11.6*  --  11.6*  --   --   --   --   --   --   CREATININE 1.92*  --  2.42*  < > 1.94* 1.90* 1.87* 1.66* 1.59*  LATICACIDVEN  --  1.49  --   --   --   --   --   --   --   < > = values in this interval not displayed.  Estimated Creatinine Clearance: 17.4 mL/min (by C-G formula based on SCr of 1.59 mg/dL (H)).    Allergies  Allergen Reactions  . Erythromycin Itching    Antimicrobials this admission: Vancomycin 12/21 >>  Cefepime 12/21 >> 12/23 CTX (12/20 and 12/21 PTA) 12/23>>  Dose adjustments this admission:  Microbiology results: 12/21 BCx: ngtd 12/21 RVP: neg 12/21 Flu: neg 12/21 resp cx: reinubated (rare budding yeast, rare GPC) 12/21 MRSA PCR: POS  Thank you for allowing pharmacy to be a part of this patient's care.  Toriano Aikey D. Nickolette Espinola, PharmD, BCPS Clinical Pharmacist Pager: [phone] 10/21/2016 12:21 PM

## 2016-10-21 NOTE — Progress Notes (Signed)
   Subjective: Patient was alert but non verbal, she was responding commands by opening her eyes and squeeze my hand. She seems comfortable.  Objective:  Vital signs in last 24 hours: Vitals:   10/21/16 0425 10/21/16 0500 10/21/16 0600 10/21/16 0804  BP: (!) 130/96 122/62 132/77 (!) 122/57  Pulse: 75 66 73 78  Resp: (!) 23 (!) 35 19 19  Temp: 98.7 F (37.1 C)   98 F (36.7 C)  TempSrc: Axillary   Oral  SpO2:    96%  Weight:      Height:       Gen. febrile old lady, resting comfortably in her bed, alert but nonverbal, opens her eyes on command and squeezes hand. HEENT. Lips and oral mucous membranes looks mildly dry. Chest. Saturating well on 2 L oxygen wire trach. There were few drops of bloody secretions around trach. Work of breathing normal, few coarse breath sounds more on the right mid to lower lung field. CVS. RRR, no drug/murmurs/gallops. Abdomen. Soft, nondistended, nontender, bowel sounds positive. PEG tube intact. Extremities. No edema, no cyanosis,  pulses 2+ bilaterally.   Assessment/Plan:  Briefly Monna FamHazetta L Kue is a 82 year old female with a PMH of Stroke, COPD, HLD, HTN, and chronic respiratory failure s/p trachyostomy, and PEG tube Brought to hospital from SNF after becoming less responsive and increased secretions from her tracheostomy.  Possible pneumonia.  she is improving with broad-spectrum antibiotics, however her initial chest x-ray was not showing any infiltrate. Her PCT is improving , it was 0.99 today . Sputum culture results are still pending, showing rare budding yeast and rare GPC.  -Blood cultures remain negative. -Repeat chest x-ray today. -Continue Vanc. And Cefepime, till we get the sputum culture results back , then adjust antibiotics accordingly .  AKI likely prerenal.  Her creatinine is improving it was 1.59 today, still above baseline which was below 1. -Continue dextrose and half normal saline for 12 more hours and reassess  BMP.  Hypernatremia . Resolved  HTN. She is still normotensive at 132/77. We will continue holding her home antihypertensives. Keep monitoring her blood pressure. Her home amlodipine can be restarted if needed, for systolic blood pressure above 161150.  Hypothyroidism -cont synthyroid.  COPD - cont duoneb.  Hx of strokenow bed bound, nonverbal with PEG tube dependance- palliative care was consulted, but the family wants to keep her full code. It looks like they do not have much insight of her condition.  Dispo: Anticipated discharge in approximately 1-2 day(s).   Arnetha CourserSumayya Allisha Harter, MD 10/21/2016, 10:04 AM Pager: [phone]

## 2016-10-22 LAB — GLUCOSE, CAPILLARY
GLUCOSE-CAPILLARY: 167 mg/dL — AB (ref 65–99)
GLUCOSE-CAPILLARY: 179 mg/dL — AB (ref 65–99)
GLUCOSE-CAPILLARY: 73 mg/dL (ref 65–99)
Glucose-Capillary: 123 mg/dL — ABNORMAL HIGH (ref 65–99)
Glucose-Capillary: 159 mg/dL — ABNORMAL HIGH (ref 65–99)

## 2016-10-22 LAB — BASIC METABOLIC PANEL
Anion gap: 7 (ref 5–15)
BUN: 36 mg/dL — AB (ref 6–20)
CHLORIDE: 117 mmol/L — AB (ref 101–111)
CO2: 19 mmol/L — ABNORMAL LOW (ref 22–32)
CREATININE: 1.37 mg/dL — AB (ref 0.44–1.00)
Calcium: 9.2 mg/dL (ref 8.9–10.3)
GFR calc Af Amer: 40 mL/min — ABNORMAL LOW (ref >60)
GFR, EST NON AFRICAN AMERICAN: 34 mL/min — AB (ref >60)
GLUCOSE: 162 mg/dL — AB (ref 65–99)
Potassium: 5.1 mmol/L (ref 3.5–5.1)
SODIUM: 143 mmol/L (ref 135–145)

## 2016-10-22 MED ORDER — FREE WATER: 200.0000 mL | Freq: Four times a day (QID) | Status: DC

## 2016-10-22 MED ORDER — FREE WATER
200.0000 mL | Status: DC
  Administered 2016-10-22 – 2016-10-24 (×13): 200 mL

## 2016-10-22 MED ORDER — AMLODIPINE BESYLATE 10 MG PO TABS
10.0000 mg | ORAL_TABLET | Freq: Every day | ORAL | Status: DC
  Administered 2016-10-23 – 2016-10-24 (×2): 10 mg via ORAL
  Filled 2016-10-22 (×2): qty 1

## 2016-10-22 NOTE — Progress Notes (Signed)
   Subjective: Patient remained nonverbal, she does follow simple commands. She is pretty much at her baseline now.  Objective:  Vital signs in last 24 hours: Vitals:   10/22/16 0400 10/22/16 0500 10/22/16 0729 10/22/16 0735  BP: (!) 144/64 (!) 145/60 138/60 138/60  Pulse: 76 80  81  Resp: (!) 25 19  (!) 21  Temp:   99.4 F (37.4 C)   TempSrc:   Axillary   SpO2:   100% 100%  Weight:      Height:       Gen. frail, old lady, resting comfortably in her bed, alert but nonverbal, opens her eyes on command and squeezes hand. Chest. Saturating well on 2 L oxygen via trach. There were few drops of bloody secretions around trach. Work of breathing normal, few coarse breath sounds bilaterally CVS. RRR, no drug/murmurs/gallops. Abdomen. Soft, nondistended, nontender, bowel sounds positive. PEG tube intact. Extremities. No edema, no cyanosis,  pulses 2+ bilaterally.  Labs. BMP Latest Ref Rng & Units 10/22/2016 10/21/2016 10/21/2016  Glucose 65 - 99 mg/dL 914(N162(H) 829(F397(H) 621(H177(H)  BUN 6 - 20 mg/dL 08(M36(H) 57(Q42(H) 46(N52(H)  Creatinine 0.44 - 1.00 mg/dL 6.29(B1.37(H) 2.84(X1.40(H) 3.24(M1.59(H)  Sodium 135 - 145 mmol/L 143 141 145  Potassium 3.5 - 5.1 mmol/L 5.1 4.3 5.1  Chloride 101 - 111 mmol/L 117(H) 118(H) 119(H)  CO2 22 - 32 mmol/L 19(L) 18(L) 19(L)  Calcium 8.9 - 10.3 mg/dL 9.2 0.1(U8.3(L) 9.2   Assessment/Plan:  Briefly Evelyn L Williamsis a 82 year old female with a PMH of Stroke, COPD, HLD, HTN, and chronic respiratory failure s/p trachyostomy, and PEG tube Brought to hospital from SNF after becoming less responsive and increased secretions from her tracheostomy.  Altered mental status/Possible pneumonia. Her repeat chest x-ray done yesterday was normal, without any infiltrate. She seems improving, very close to her baseline now. Her urine culture just grew yeast. Her sputum culture results are still pending, as they were growing some gram-positive cocci, sensitivity results are still pending. -We will await  the sensitivity results, then discharge her with appropriate antibiotics. Her cefepime was discontinued yesterday and she was started on ceftriaxone. -Meanwhile we will continue wearing can ceftriaxone.  AKI likely prerenal.  Her creatinine is improving it was 1.37 today, still above baseline which was below 1. -Add free water through her PEG tube.  Hypertension. Her blood pressure remains between systolic 131-148 over the last 24 hour. -Restart her home dose of amlodipine at 10 mg daily. -Keep holding hydralazine and Lopressor, can be restarted if blood pressure remains above 150.  Hypernatremia . Resolved.  Hypothyroidism -cont synthyroid.  COPD - cont duoneb.  Hx of strokenow bed bound, nonverbal with PEG tube dependance- palliative care was consulted, but the family wants to keep her full code. It looks like they do not have much insight of her condition.  Dispo: Anticipated discharge in approximately 1 day(s).   Evelyn CourserSumayya Theon Sobotka, MD 10/22/2016, 11:07 AM Pager: [phone]

## 2016-10-22 NOTE — Progress Notes (Signed)
Internal Medicine Attending:   I saw and examined the patient. I reviewed the resident's note and I agree with the resident's findings and plan as documented in the resident's note. Patient overall unchanged from yesterday,  Following commands, non-verbal.  Lungs are overall clear, she remains on trach collar.  We are still awaiting on the respiratory culture to narrow antibiotics further (currently re incubated).  Likely we can discharge her back to her facility tomorrow, would prefer plan to either discontinue Ceftriaxone or vancomycin before she leaves and plan for 7 day total course of antibiotics.

## 2016-10-23 DIAGNOSIS — J15212 Pneumonia due to Methicillin resistant Staphylococcus aureus: Secondary | ICD-10-CM

## 2016-10-23 DIAGNOSIS — L8990 Pressure ulcer of unspecified site, unspecified stage: Secondary | ICD-10-CM | POA: Insufficient documentation

## 2016-10-23 LAB — BASIC METABOLIC PANEL
ANION GAP: 6 (ref 5–15)
BUN: 29 mg/dL — ABNORMAL HIGH (ref 6–20)
CALCIUM: 9.3 mg/dL (ref 8.9–10.3)
CO2: 22 mmol/L (ref 22–32)
Chloride: 113 mmol/L — ABNORMAL HIGH (ref 101–111)
Creatinine, Ser: 1.24 mg/dL — ABNORMAL HIGH (ref 0.44–1.00)
GFR, EST AFRICAN AMERICAN: 45 mL/min — AB (ref >60)
GFR, EST NON AFRICAN AMERICAN: 39 mL/min — AB (ref >60)
Glucose, Bld: 167 mg/dL — ABNORMAL HIGH (ref 65–99)
POTASSIUM: 5.1 mmol/L (ref 3.5–5.1)
Sodium: 141 mmol/L (ref 135–145)

## 2016-10-23 LAB — GLUCOSE, CAPILLARY
GLUCOSE-CAPILLARY: 98 mg/dL (ref 65–99)
GLUCOSE-CAPILLARY: 184 mg/dL — AB (ref 65–99)
Glucose-Capillary: 145 mg/dL — ABNORMAL HIGH (ref 65–99)
Glucose-Capillary: 146 mg/dL — ABNORMAL HIGH (ref 65–99)
Glucose-Capillary: 148 mg/dL — ABNORMAL HIGH (ref 65–99)
Glucose-Capillary: 156 mg/dL — ABNORMAL HIGH (ref 65–99)
Glucose-Capillary: 202 mg/dL — ABNORMAL HIGH (ref 65–99)

## 2016-10-23 LAB — CBC
HEMATOCRIT: 24.4 % — AB (ref 36.0–46.0)
HEMOGLOBIN: 7.5 g/dL — AB (ref 12.0–15.0)
MCH: 31.8 pg (ref 26.0–34.0)
MCHC: 30.7 g/dL (ref 30.0–36.0)
MCV: 103.4 fL — ABNORMAL HIGH (ref 78.0–100.0)
Platelets: 241 10*3/uL (ref 150–400)
RBC: 2.36 MIL/uL — AB (ref 3.87–5.11)
RDW: 15.1 % (ref 11.5–15.5)
WBC: 15.9 10*3/uL — ABNORMAL HIGH (ref 4.0–10.5)

## 2016-10-23 LAB — PROCALCITONIN: PROCALCITONIN: 0.41 ng/mL

## 2016-10-23 LAB — CULTURE, RESPIRATORY

## 2016-10-23 LAB — CULTURE, RESPIRATORY W GRAM STAIN

## 2016-10-23 MED ORDER — VANCOMYCIN HCL IN DEXTROSE 1-5 GM/200ML-% IV SOLN
1000.0000 mg | INTRAVENOUS | Status: DC
  Administered 2016-10-23: 1000 mg via INTRAVENOUS
  Filled 2016-10-23: qty 200

## 2016-10-23 NOTE — Progress Notes (Signed)
   Subjective: Patient's condition remained the same, follow simple commands by opening eyes and squeezing with left hand.  Objective:  Vital signs in last 24 hours: Vitals:   10/23/16 0024 10/23/16 0413 10/23/16 0417 10/23/16 0826  BP: (!) 136/57 (!) 145/82 (!) 145/82 138/62  Pulse: 76 71 70 90  Resp: 19 17 17  (!) 22  Temp: 98.7 F (37.1 C) 98.2 F (36.8 C)    TempSrc: Axillary Axillary    SpO2: 100% 100% 100% 100%  Weight:  43.1 kg (95 lb 0.3 oz)    Height:       Gen.frail, old lady, resting comfortably in her bed,alert but nonverbal,in no acute distress. Chest.Mostly clear with normal work of breathing, few coarse breath sounds. CVS. Regular rate and rhythm. Abdomen.Soft, nondistended, nontender, bowel sounds positive. PEG tube intact. Extremities.No edema, no cyanosis, pulses 2+ bilaterally. Neuro. Alert and following simple commands, having repetitive mouth movements like chewing. Squeeze by using her left hand and wiggle left foot toes. Do not move right side of upper and lower extremity.  Labs. CBC Latest Ref Rng & Units 10/23/2016 10/19/2016 10/19/2016  WBC 4.0 - 10.5 K/uL 15.9(H) 11.6(H) 11.6(H)  Hemoglobin 12.0 - 15.0 g/dL 7.5(L) 11.6(L) 6.7(LL)  Hematocrit 36.0 - 46.0 % 24.4(L) 37.8 42.4  Platelets 150 - 400 K/uL 241 261 236   BMP Latest Ref Rng & Units 10/23/2016 10/22/2016 10/21/2016  Glucose 65 - 99 mg/dL 811(B167(H) 147(W162(H) 295(A397(H)  BUN 6 - 20 mg/dL 21(H29(H) 08(M36(H) 57(Q42(H)  Creatinine 0.44 - 1.00 mg/dL 4.69(G1.24(H) 2.95(M1.37(H) 8.41(L1.40(H)  Sodium 135 - 145 mmol/L 141 143 141  Potassium 3.5 - 5.1 mmol/L 5.1 5.1 4.3  Chloride 101 - 111 mmol/L 113(H) 117(H) 118(H)  CO2 22 - 32 mmol/L 22 19(L) 18(L)  Calcium 8.9 - 10.3 mg/dL 9.3 9.2 2.4(M8.3(L)   Sputum culture. Growing few staph aureus and few Candida. Sensitivity results still pending. Blood culture. Still no growth after 3 days.   Assessment/Plan:  Lars PinksHazetta L Williamsis a 82 year old female with a PMH of Stroke, COPD, HLD, HTN,  and chronic respiratory failure s/p trachyostomy,and PEG tube Brought to hospital from SNFafter becoming less responsive and increased secretions from her tracheostomy.  Altered mental status/Possible pneumonia. She is pretty much at her baseline now. Her sputum is growing few staph aureus and few Candida. She is getting scored staph aureus coverage with vanc. and ceftriaxone. Waiting for sensitivity results. Her leukocyte count is increasing it was 15.9 today from 11.6 before, she remains afebrile. We will add Candida coverage with fluconazole.  Anemia. Her hemoglobin dropped to 7.5 today, it was 11.6, 2 days before. She is having mild bleeding with suctioning of her trach. We will check FOBT.  AKI likely prerenal.  Her creatinine is improving it was 1.24 today,still above baseline which was below 1. -Continue with free water through PEG tube.  Hypertension. Her blood pressure remains between systolic 124-145 over the last 24 hour. Amlodipine was added yesterday. We will continue with amlodipine and continue holding hydralazine and Lopressor.  Hypothyroidism -cont synthyroid.  COPD - cont duoneb.  Hx of strokenow bed bound, nonverbal with PEG tube dependance- palliative care was consulted,but the family wants to keep her full code.It looks like they do not have much insight of her condition.  Hypernatremia .Resolved.  Dispo: Her discharge depends on her placement through Child psychotherapistsocial worker. She is at her baseline now.  Arnetha CourserSumayya Tysen Roesler, MD 10/23/2016, 8:39 AM Pager: [phone]

## 2016-10-23 NOTE — Progress Notes (Signed)
Pharmacy Antibiotic Note  Evelyn [name] is a 82 y.o. female admitted from SNF on 10/19/2016 with sepsis/pneumonia. Received ceftriaxone 2g IV x1 on 12/20 and 1g on 12/21 at nursing home.  With AKI, but improving. SCr currently 1.24, CrCl ~23 ml/min Respiratory culture with few staph aureus/few yeast Day #5 of 7 of treatment  Plan: -Vancomycin to 1 gram iv Q 48 hours -Ceftriaxone 1g IV q24h   Temp (24hrs), Avg:98.2 F (36.8 C), Min:98 F (36.7 C), Max:98.7 F (37.1 C)   Recent Labs Lab 10/19/16 0914 10/19/16 0950 10/19/16 1139  10/20/16 1800 10/21/16 0535 10/21/16 1424 10/22/16 0550 10/23/16 0411  WBC 11.6*  --  11.6*  --   --   --   --   --  15.9*  CREATININE 1.92*  --  2.42*  < > 1.66* 1.59* 1.40* 1.37* 1.24*  LATICACIDVEN  --  1.49  --   --   --   --   --   --   --   < > = values in this interval not displayed.  Estimated Creatinine Clearance: 23 mL/min (by C-G formula based on SCr of 1.24 mg/dL (H)).    Allergies  Allergen Reactions  . Erythromycin Itching    Thank you for allowing pharmacy to be a part of this patient's care. Okey RegalLisa Justice Milliron, PharmD [phone] 10/23/2016 9:41 AM

## 2016-10-23 NOTE — Progress Notes (Signed)
Internal Medicine Attending:   I saw and examined the patient. I reviewed the resident's note and I agree with the resident's findings and plan as documented in the resident's note. Ms Mayford KnifeWilliams remains non-verbal and overall similar to yesterday, lung sounds are grossly clear on exam of anterior lung fields. She remains on trach collar, and vitals are within normal limits.  She is still awakening to voice and following simple commands.   Our respiratory culture has revealed few staph aureus and few yeast.  She may have a component of oral thrush but I do not suspect the yeast is significant.  We currently have her on Vancomycin and Ceftriaxone.  I suspect that we can discontinue ceftriaxone after today's dose given that she received ceftriaxone in her SNF prior to arrival.  I would like to treat her for a total of 7 days with IV vancomycin due to the staph aureus.  Once she receives the dose today she could potentially be discharged to her SNF.  We could possibly consider one more dose of Vancomycin on the 27th but this could be given at her SNF. Of note her WBC is up slightly from admission however she is clinically improving.  Her Hgb is roughly stable with overall trend.

## 2016-10-24 DIAGNOSIS — B3781 Candidal esophagitis: Secondary | ICD-10-CM

## 2016-10-24 LAB — BASIC METABOLIC PANEL
Anion gap: 7 (ref 5–15)
BUN: 22 mg/dL — AB (ref 6–20)
CHLORIDE: 108 mmol/L (ref 101–111)
CO2: 24 mmol/L (ref 22–32)
CREATININE: 1.14 mg/dL — AB (ref 0.44–1.00)
Calcium: 9.2 mg/dL (ref 8.9–10.3)
GFR, EST AFRICAN AMERICAN: 50 mL/min — AB (ref >60)
GFR, EST NON AFRICAN AMERICAN: 43 mL/min — AB (ref >60)
Glucose, Bld: 159 mg/dL — ABNORMAL HIGH (ref 65–99)
Potassium: 4.4 mmol/L (ref 3.5–5.1)
SODIUM: 139 mmol/L (ref 135–145)

## 2016-10-24 LAB — CULTURE, BLOOD (ROUTINE X 2)
CULTURE: NO GROWTH
Culture: NO GROWTH

## 2016-10-24 LAB — GLUCOSE, CAPILLARY
GLUCOSE-CAPILLARY: 135 mg/dL — AB (ref 65–99)
GLUCOSE-CAPILLARY: 160 mg/dL — AB (ref 65–99)
GLUCOSE-CAPILLARY: 157 mg/dL — AB (ref 65–99)

## 2016-10-24 MED ORDER — FLUCONAZOLE 40 MG/ML PO SUSR
200.0000 mg | Freq: Once | ORAL | Status: AC
  Administered 2016-10-24: 200 mg via ORAL
  Filled 2016-10-24: qty 5

## 2016-10-24 MED ORDER — GLUCERNA 1.2 CAL PO LIQD: 1000.0000 mL | ORAL | 12 refills | Status: DC

## 2016-10-24 MED ORDER — FREE WATER: 200.0000 mL | 12 refills | Status: AC

## 2016-10-24 MED ORDER — FLUCONAZOLE 40 MG/ML PO SUSR: 100.0000 mg | Freq: Every day | ORAL | 0 refills | Status: AC

## 2016-10-24 MED ORDER — FLUCONAZOLE 40 MG/ML PO SUSR: 100.0000 mg | Freq: Every day | ORAL | Status: DC

## 2016-10-24 MED ORDER — DEXTROSE 5 % IV SOLN: 1.0000 g | INTRAVENOUS | 0 refills | Status: AC

## 2016-10-24 NOTE — Progress Notes (Signed)
Subjective: Her condition remained the same. Just follow simple commands like squeezing hand. Hardly open her eyes.  Objective:  Vital signs in last 24 hours: Vitals:   10/24/16 0421 10/24/16 0803 10/24/16 0842 10/24/16 0901  BP:  (!) 157/61 (!) 157/61 132/69  Pulse: 76 75  77  Resp: 19 17  19   Temp:  98.2 F (36.8 C)    TempSrc:  Axillary    SpO2: 96% 100%  100%  Weight:   44.7 kg (98 lb 8.7 oz)   Height:       Gen.frail,old lady,in no acute distress. Chest.Mostly clear with normal work of breathing, few coarse breath sounds. CVS. Regular rate and rhythm. Abdomen.Soft, nondistended, nontender, bowel sounds positive. PEG tube intact. Extremities.No edema, no cyanosis, pulses 2+ bilaterally.  Labs. Specimen Description TRACHEAL ASPIRATE   Special Requests NONE   Gram Stain DEGENERATED CELLULAR MATERIAL PRESENT RARE BUDDING YEAST SEEN RARE GRAM POSITIVE COCCI IN PAIRS   Culture FEW STAPHYLOCOCCUS AUREUS FEW CANDIDA ALBICANS   Report Status 10/23/2016 FINAL   Organism ID, Bacteria STAPHYLOCOCCUS AUREUS   Resulting Agency [facility]  Susceptibility    Staphylococcus aureus    MIC    CIPROFLOXACIN <=0.5 SENSITIVE "><=0.5 SENSI... Sensitive    CLINDAMYCIN <=0.25 SENSITIVE "><=0.25 SENS... Sensitive    ERYTHROMYCIN <=0.25 SENSITIVE "><=0.25 SENS... Sensitive    GENTAMICIN <=0.5 SENSITIVE "><=0.5 SENSI... Sensitive    Inducible Clindamycin NEGATIVE  Sensitive    OXACILLIN 0.5 SENSITIVE  Sensitive    RIFAMPIN <=0.5 SENSITIVE "><=0.5 SENSI... Sensitive    TETRACYCLINE <=1 SENSITIVE "><=1 SENSITIVE  Sensitive    TRIMETH/SULFA <=10 SENSITIVE "><=10 SENSIT... Sensitive    VANCOMYCIN <=0.5 SENSITIVE "><=0.5 SENSI... Sensitive         Susceptibility Comments   Staphylococcus aureus  FEW STAPHYLOCOCCUS AUREUS     BMP Latest Ref Rng & Units 10/24/2016 10/23/2016 10/22/2016  Glucose 65 - 99 mg/dL 604(V159(H) 409(W167(H) 119(J162(H)  BUN 6 - 20 mg/dL 47(W22(H) 29(F29(H) 62(Z36(H)  Creatinine 0.44  - 1.00 mg/dL 3.08(M1.14(H) 5.78(I1.24(H) 6.96(E1.37(H)  Sodium 135 - 145 mmol/L 139 141 143  Potassium 3.5 - 5.1 mmol/L 4.4 5.1 5.1  Chloride 101 - 111 mmol/L 108 113(H) 117(H)  CO2 22 - 32 mmol/L 24 22 19(L)  Calcium 8.9 - 10.3 mg/dL 9.2 9.3 9.2    Assessment/Plan:  Evelyn L Williamsis a 82 year old female with a PMH of Stroke, COPD, HLD, HTN, and chronic respiratory failure s/p trachyostomy,and PEG tube Brought to hospital from SNFafter becoming less responsive and increased secretions from her tracheostomy.  Altered mental status/Possible pneumonia. She is pretty much at her baseline now. Her sputum is growing few staph aureus and few Candida.  she is having MSSA with good sensitivity. She was started on cefepime on December 21, later changed to ceftriaxone on 23rd. Today is day 6 of antibiotics. She needs to complete a total 7 days. One more dose of ceftriaxone can be given at skilled nursing facility. Discontinue vancomycin. We added Candida coverage with fluconazole.  Anemia. Her hemoglobin dropped to 7.5 today, it was 11.6, 2 days before. She is having mild bleeding with suctioning of her trach.   AKI likely prerenal.  Her creatinine is improving it was 1.14today. -Continue with free water through PEG tube.  Hypertension.Her blood pressure remains between systolic 96-136 over the last 24 hour. Amlodipine was added yesterday. We will continue with amlodipine and continue holding hydralazine and Lopressor.  Hypothyroidism -cont synthyroid.  COPD - cont duoneb.  Hx  of strokenow bed bound, nonverbal with PEG tube dependance- palliative care was consulted,but the family wants to keep her full code.It looks like they do not have much insight of her condition.  Hypernatremia .Resolved.  [facility]Arnetha Courser.   Duaa Stelzner, MD 10/24/2016, 9:24 AM Pager: [phone]

## 2016-10-24 NOTE — Progress Notes (Signed)
Pt[facility] at [facility] baseline; [facility] paper gown and trach care supplies. Pt shows no evidence of learning with education. [facility]

## 2016-10-24 NOTE — Progress Notes (Signed)
Internal Medicine Attending:   I saw and examined the patient. I reviewed the resident's note and I agree with the resident's findings and plan as documented in the resident's note. Evelyn [name] is at her baseline, she does awaken to voice, and follow some simple commands.  On exam she remains on trach collar at 4L with 99% O2 sat. Lungs are grossly CTA. Abdomen is soft, and she has no lower extremity edema.  Her suptum culture has returned as MSSA.  We will therefore discontinue Vancomycin.  Given that she was on ceftriaxone at her SNF it would be reasonable to repeat one last dose for a 7 day course tomorrow, this could also be changed to Ancef, or oral Bactrim.  She is otherwise stable for discharge to her SNF when a bed is available.  Also to clarify Dr Shanda BumpsAmin's note, this is not Possible Pneumonia.  It is Pneumonia due to MSSA.  I agree with her other assessments and plan.

## 2016-10-24 NOTE — Discharge Summary (Signed)
Name: Evelyn [name] MRN: [id number] DOB: 12-Nov-1929 82 y.o. PCP: No primary care provider on file.  Date of Admission: 10/19/2016  9:05 AM Date of Discharge: 10/24/2016 Attending Physician: Gust RungErik C Hoffman, DO  Discharge Diagnosis: 1. Pneumonia. Active Problems:   AKI (acute kidney injury) (HCC)   Hypertension   COPD (chronic obstructive pulmonary disease) (HCC)   Hyperlipidemia   Anemia   Hypernatremia   Hypokalemia   Chronic respiratory failure with hypoxia (HCC)   Status post tracheostomy (HCC)   Gastrostomy tube dependent (HCC)   Goals of care, counseling/discussion   Palliative care encounter   Pressure injury of skin   Discharge Medications: Allergies as of 10/24/2016      Reactions   Erythromycin Itching      Medication List    STOP taking these medications   cefTRIAXone 2 g in dextrose 5 % 50 mL Replaced by:  cefTRIAXone 1 g in dextrose 5 % 50 mL   hydrALAZINE 50 MG tablet Commonly known as:  APRESOLINE   metoprolol tartrate 25 MG tablet Commonly known as:  LOPRESSOR   predniSONE 50 MG tablet Commonly known as:  DELTASONE   ROCEPHIN 1 g injection Generic drug:  cefTRIAXone     TAKE these medications   amantadine 100 MG capsule Commonly known as:  SYMMETREL Take 100 mg by mouth daily.   amLODipine 10 MG tablet Commonly known as:  NORVASC Take 10 mg by mouth daily.   aspirin 81 MG chewable tablet Chew 81 mg by mouth daily.   atorvastatin 40 MG tablet Commonly known as:  LIPITOR Take 40 mg by mouth daily.   cefTRIAXone 1 g in dextrose 5 % 50 mL Inject 1 g into the vein daily. Replaces:  cefTRIAXone 2 g in dextrose 5 % 50 mL   docusate 50 MG/5ML liquid Commonly known as:  COLACE Take 100 mg by mouth 2 (two) times daily.   famotidine 20 MG tablet Commonly known as:  PEPCID Take 20 mg by mouth daily.   feeding supplement (GLUCERNA 1.2 CAL) Liqd Place 1,000 mLs into feeding tube continuous.   fentaNYL 12 MCG/HR Commonly known  as:  DURAGESIC - dosed mcg/hr Place 12.5 mcg onto the skin every 3 (three) days.   ferrous sulfate 300 (60 Fe) MG/5ML syrup Take 300 mg by mouth 2 (two) times daily with a meal.   fluconazole 40 MG/ML suspension Commonly known as:  DIFLUCAN Take 2.5 mLs (100 mg total) by mouth daily. Start taking on:  10/25/2016   free water Soln Place 200 mLs into feeding tube every 4 (four) hours.   guaifenesin 100 MG/5ML syrup Commonly known as:  ROBITUSSIN Take 200 mg by mouth every 8 (eight) hours as needed for congestion.   insulin aspart 100 UNIT/ML injection Commonly known as:  novoLOG Inject 10 Units into the skin once.   ipratropium-albuterol 0.5-2.5 (3) MG/3ML Soln Commonly known as:  DUONEB Take 3 mLs by nebulization every 8 (eight) hours as needed (wheezing).   levothyroxine 25 MCG tablet Commonly known as:  SYNTHROID, LEVOTHROID Take 25 mcg by mouth daily before breakfast.   multivitamin with minerals Tabs tablet Take 1 tablet by mouth daily.   sertraline 100 MG tablet Commonly known as:  ZOLOFT Take 100 mg by mouth at bedtime.       Disposition and follow-up:   [name]  At the hospital follow up visit please address:  1.  Any recent change in her mental status. -Completion of antibiotic, she was supposed to get one more dose of ceftriaxone at skilled nursing facility.  2.  Labs / imaging needed at time of follow-up: None  3.  Pending labs/ test needing follow-up: None  Follow-up Appointments:   Hospital Course by problem list:  Evelyn [name] a 82 year old female with a PMH of Stroke, COPD, HLD, HTN, and chronic respiratory failure s/p trachyostomy,and PEG tube Brought to hospital from SNFafter becoming less responsive and increased secretions from her tracheostomy.  Altered mental status/Pneumonia. Clinically she had few coarse breath sounds, her chest x-ray remained  negative. She had leukocytosis and her sputum culture grew staph aureus(MSSA) and Candida. In the hospital she got. Cefepime from 10/19/2016 through 10/21/2016. Ceftriaxone started on 10/21/2016, today is day 6 of antibiotics. She will need 1 more dose of ceftriaxone to complete a 7 day course. Fluconazole was added on December 26. She will need total 14 days of fluconazole.  Her mental status improved to her baseline. She remained nonverbal, opens eyes and squeeze with left hand on command.  AKI likely prerenal. She was dehydrated on admission with BUN of 101 and creatinine of 2.42. At baseline she has normal renal functions. With gentle rehydration and free water through PEG her creatinine dropped to 1.14 and BUN of 22 on discharge. We recommend 200 mL of water through PEG every 4-6 hour.  Hypertension. Her blood pressure was soft on admission. Initially her home antihypertensives were held. Later amlodipine was added, she remained normotensive with amlodipine. We kept holding hydralazine and Lopressor. That can be restarted if needed by her primary care.  Hypernatremia. Her sodium level on admission was 155, her hyponatremia resolved with gentle rehydration and replenishing free water deficit. It was 139 on discharge.  Diabetes. Her blood sugar was maintained on SSI.  Hypothyroidism. We continued her home dose of Synthroid.  COPD. She did not had any excessive wheezing. We continued her home DuoNeb.  Chronic respiratory failure S/P tracheostomy. Initially she was requiring more oxygen to maintain saturation. Later she was maintained at her baseline of 2. L of oxygen.  Hx of strokenow bed bound, nonverbal with PEG tube dependance- palliative care was consulted,but the family wants to keep her full code.It looks like they do not have much insight of her condition. Her overall prognosis is poor.  Discharge Vitals:   BP 132/69   Pulse 77   Temp 98.2 F (36.8 C) (Axillary)   Resp 19    Ht 5' (1.524 m)   Wt 44.7 kg (98 lb 8.7 oz)   SpO2 100%   BMI 19.25 kg/m   Gen.frail,old lady,in no acute distress. Chest.Mostly clear with normal work of breathing, few coarse breath sounds. CVS.Regular rate and rhythm. Abdomen.Soft, nondistended, nontender, bowel sounds positive. PEG tube intact. Extremities.No edema, no cyanosis, pulses 2+ bilaterally.  Pertinent Labs, Studies, and Procedures:  CBC Latest Ref Rng & Units 10/23/2016 10/19/2016 10/19/2016  WBC 4.0 - 10.5 K/uL 15.9(H) 11.6(H) 11.6(H)  Hemoglobin 12.0 - 15.0 g/dL 7.5(L) 11.6(L) 6.7(LL)  Hematocrit 36.0 - 46.0 % 24.4(L) 37.8 42.4  Platelets 150 - 400 K/uL 241 261 236   BMP Latest Ref Rng & Units 10/24/2016 10/23/2016 10/22/2016  Glucose 65 - 99 mg/dL 161(W) 960(A) 540(J)  BUN 6 - 20 mg/dL 81(X) 91(Y) 78(G)  Creatinine 0.44 - 1.00 mg/dL 9.56(O) 1.30(Q) 6.57(Q)  Sodium 135 - 145 mmol/L 139 141 143  Potassium 3.5 - 5.1 mmol/L 4.4  5.1 5.1  Chloride 101 - 111 mmol/L 108 113(H) 117(H)  CO2 22 - 32 mmol/L 24 22 19(L)  Calcium 8.9 - 10.3 mg/dL 9.2 9.3 9.2   Urinalysis    Component Value Date/Time   COLORURINE YELLOW 10/19/2016 2239   APPEARANCEUR CLOUDY (A) 10/19/2016 2239   LABSPEC 1.013 10/19/2016 2239   PHURINE 5.0 10/19/2016 2239   GLUCOSEU NEGATIVE 10/19/2016 2239   HGBUR NEGATIVE 10/19/2016 2239   BILIRUBINUR NEGATIVE 10/19/2016 2239   KETONESUR NEGATIVE 10/19/2016 2239   PROTEINUR NEGATIVE 10/19/2016 2239   NITRITE NEGATIVE 10/19/2016 2239   LEUKOCYTESUR LARGE (A) 10/19/2016 2239    Urine culture  Order: [id number]  Status:  Final result Visible to patient:  No (Not Released) Next appt:  None   5d ago  Specimen Description URINE, CATHETERIZED   Special Requests unknown Normal   Culture >=100,000 COLONIES/mL YEAST    Report Status 10/21/2016 FINAL   Resulting Agency [facility]    Specimen Collected: 10/19/16          Sputum culture.  Specimen Description TRACHEAL ASPIRATE     Special Requests NONE   Gram Stain DEGENERATED CELLULAR MATERIAL PRESENT RARE BUDDING YEAST SEEN RARE GRAM POSITIVE COCCI IN PAIRS   Culture FEW STAPHYLOCOCCUS AUREUS FEW CANDIDA ALBICANS   Report Status 10/23/2016 FINAL   Organism ID, Bacteria STAPHYLOCOCCUS AUREUS   Resulting Agency [facility]  Susceptibility          Staphylococcus aureus    MIC    CIPROFLOXACIN <=0.5 SENSITIVE "><=0.5 SENSI... Sensitive    CLINDAMYCIN <=0.25 SENSITIVE "><=0.25 SENS... Sensitive    ERYTHROMYCIN <=0.25 SENSITIVE "><=0.25 SENS... Sensitive    GENTAMICIN <=0.5 SENSITIVE "><=0.5 SENSI... Sensitive    Inducible Clindamycin NEGATIVE  Sensitive    OXACILLIN 0.5 SENSITIVE  Sensitive    RIFAMPIN <=0.5 SENSITIVE "><=0.5 SENSI... Sensitive    TETRACYCLINE <=1 SENSITIVE "><=1 SENSITIVE  Sensitive    TRIMETH/SULFA <=10 SENSITIVE "><=10 SENSIT... Sensitive    VANCOMYCIN <=0.5 SENSITIVE "><=0.5 SENSI... Sensitive         Susceptibility Comments   Staphylococcus aureus  FEW STAPHYLOCOCCUS AUREUS      Blood culture. No growths till day 5.  DG Chest. FINDINGS: Cardiac shadow is within normal limits. A right-sided central venous line is noted in satisfactory position. Tracheostomy tube is seen. No focal infiltrate or sizable effusion is noted. No bony abnormality is seen.  IMPRESSION: No acute abnormality noted.   Discharge Instructions: Discharge Instructions    Diet - low sodium heart healthy    Complete by:  As directed    Increase activity slowly    Complete by:  As directed       Signed: Arnetha CourserSumayya Armandina Iman, MD 10/24/2016, 10:33 AM   Pager: [phone]

## 2016-10-24 NOTE — Progress Notes (Signed)
Trach secure. SXN trach -small pink tinged thick. Pt tol well

## 2016-10-24 NOTE — Progress Notes (Signed)
Patient will DC to: [facility] Anticipated DC date: 10/24/16 Family notified: Feliz Beamravis, son Transport by: Sharin MonsPTAR   Per MD patient ready for DC to . RN, patient, patient's family, and facility notified of DC. Discharge Summary sent to facility. RN given number for report. DC packet on chart. Ambulance transport requested for patient.   CSW signing off.  Cristobal GoldmannNadia Ramya Vanbergen, [place] Clinical Social Worker [phone]

## 2016-10-24 NOTE — Clinical Social Work Note (Signed)
Clinical Social Work Assessment  Patient Details  Name: Evelyn [name] MRN: [id number] Date of Birth: January 10, 1930  Date of referral:  10/24/16               Reason for consult:  Discharge Planning                Permission sought to share information with:  Facility Medical sales representativeContact Representative, Family Supports Permission granted to share information::  No  Name::     Production assistant, radioTravis  Agency::  [facility]  Relationship::  Son  Contact Information:  [phone]  Housing/Transportation Living arrangements for the past 2 months:  Skilled Nursing Facility Source of Information:  Adult Children Patient Interpreter Needed:  None Criminal Activity/Legal Involvement Pertinent to Current Situation/Hospitalization:  No - Comment as needed Significant Relationships:  Adult Children Lives with:  Facility Resident Do you feel safe going back to the place where you live?  Yes Need for family participation in patient care:  Yes (Comment)  Care giving concerns:  CSW received consult for discharge planning needs. Patient is disoriented. CSw spoke with patient's son Feliz Beamravis, and left a voicemail for other son. Feliz Beamravis reported that [facility] and will return at discharge. CSW to continue to follow and assist with discharge planning needs.   Social Worker assessment / plan:  [facility] by [facility].  Employment status:  Retired Health and safety inspectornsurance information:  Medicare PT Recommendations:  Not assessed at this time Information / Referral to community resources:  Skilled Nursing Facility  Patient/Family's Response to care:  [name] expressed appreciation of CSW letting him know of patient's discharge plan.  Patient/Family's Understanding of and Emotional Response to Diagnosis, Current Treatment, and Prognosis:  No questions/concerns reported at this time.  Emotional Assessment Appearance:  Appears stated age Attitude/Demeanor/Rapport:  Unable  to Assess Affect (typically observed):  Unable to Assess Orientation:   (Disoriented; Intubated) Alcohol / Substance use:  Not Applicable Psych involvement (Current and /or in the community):  No (Comment)  Discharge Needs  Concerns to be addressed:  Care Coordination Readmission within the last 30 days:  No Current discharge risk:  None Barriers to Discharge:  No Barriers Identified   Mearl Latinadia S Jeyren Danowski, LCSWA 10/24/2016, 2:00 PM

## 2016-11-26 ENCOUNTER — Inpatient Hospital Stay (HOSPITAL_COMMUNITY)
Admission: EM | Admit: 2016-11-26 | Discharge: 2016-12-04 | DRG: 871 | Disposition: A | Discharge status: Skilled Nursing Facility | Payer: Medicare Other | Attending: Nephrology | Admitting: Nephrology

## 2016-11-26 ENCOUNTER — Emergency Department (HOSPITAL_COMMUNITY): Payer: Medicare Other

## 2016-11-26 ENCOUNTER — Inpatient Hospital Stay (HOSPITAL_COMMUNITY): Payer: Medicare Other

## 2016-11-26 DIAGNOSIS — J810 Acute pulmonary edema: Secondary | ICD-10-CM | POA: Diagnosis present

## 2016-11-26 DIAGNOSIS — N179 Acute kidney failure, unspecified: Secondary | ICD-10-CM | POA: Diagnosis present

## 2016-11-26 DIAGNOSIS — E1151 Type 2 diabetes mellitus with diabetic peripheral angiopathy without gangrene: Secondary | ICD-10-CM | POA: Diagnosis present

## 2016-11-26 DIAGNOSIS — N390 Urinary tract infection, site not specified: Secondary | ICD-10-CM | POA: Diagnosis present

## 2016-11-26 DIAGNOSIS — E43 Unspecified severe protein-calorie malnutrition: Secondary | ICD-10-CM | POA: Diagnosis present

## 2016-11-26 DIAGNOSIS — Z515 Encounter for palliative care: Secondary | ICD-10-CM | POA: Diagnosis not present

## 2016-11-26 DIAGNOSIS — Z6821 Body mass index (BMI) 21.0-21.9, adult: Secondary | ICD-10-CM

## 2016-11-26 DIAGNOSIS — E876 Hypokalemia: Secondary | ICD-10-CM | POA: Diagnosis present

## 2016-11-26 DIAGNOSIS — J069 Acute upper respiratory infection, unspecified: Secondary | ICD-10-CM | POA: Diagnosis not present

## 2016-11-26 DIAGNOSIS — E878 Other disorders of electrolyte and fluid balance, not elsewhere classified: Secondary | ICD-10-CM | POA: Diagnosis present

## 2016-11-26 DIAGNOSIS — R81 Glycosuria: Secondary | ICD-10-CM | POA: Diagnosis not present

## 2016-11-26 DIAGNOSIS — J9611 Chronic respiratory failure with hypoxia: Secondary | ICD-10-CM | POA: Diagnosis not present

## 2016-11-26 DIAGNOSIS — E1122 Type 2 diabetes mellitus with diabetic chronic kidney disease: Secondary | ICD-10-CM | POA: Diagnosis present

## 2016-11-26 DIAGNOSIS — E1165 Type 2 diabetes mellitus with hyperglycemia: Secondary | ICD-10-CM | POA: Diagnosis present

## 2016-11-26 DIAGNOSIS — E860 Dehydration: Secondary | ICD-10-CM | POA: Diagnosis not present

## 2016-11-26 DIAGNOSIS — E440 Moderate protein-calorie malnutrition: Secondary | ICD-10-CM | POA: Diagnosis not present

## 2016-11-26 DIAGNOSIS — D649 Anemia, unspecified: Secondary | ICD-10-CM | POA: Diagnosis not present

## 2016-11-26 DIAGNOSIS — I129 Hypertensive chronic kidney disease with stage 1 through stage 4 chronic kidney disease, or unspecified chronic kidney disease: Secondary | ICD-10-CM | POA: Diagnosis present

## 2016-11-26 DIAGNOSIS — N189 Chronic kidney disease, unspecified: Secondary | ICD-10-CM | POA: Diagnosis present

## 2016-11-26 DIAGNOSIS — A409 Streptococcal sepsis, unspecified: Secondary | ICD-10-CM | POA: Diagnosis not present

## 2016-11-26 DIAGNOSIS — E039 Hypothyroidism, unspecified: Secondary | ICD-10-CM | POA: Diagnosis present

## 2016-11-26 DIAGNOSIS — E785 Hyperlipidemia, unspecified: Secondary | ICD-10-CM | POA: Diagnosis present

## 2016-11-26 DIAGNOSIS — R1310 Dysphagia, unspecified: Secondary | ICD-10-CM | POA: Diagnosis present

## 2016-11-26 DIAGNOSIS — Z930 Tracheostomy status: Secondary | ICD-10-CM

## 2016-11-26 DIAGNOSIS — E870 Hyperosmolality and hypernatremia: Secondary | ICD-10-CM | POA: Diagnosis present

## 2016-11-26 DIAGNOSIS — Z931 Gastrostomy status: Secondary | ICD-10-CM | POA: Diagnosis not present

## 2016-11-26 DIAGNOSIS — A401 Sepsis due to streptococcus, group B: Secondary | ICD-10-CM | POA: Diagnosis present

## 2016-11-26 DIAGNOSIS — D638 Anemia in other chronic diseases classified elsewhere: Secondary | ICD-10-CM | POA: Diagnosis present

## 2016-11-26 DIAGNOSIS — R6521 Severe sepsis with septic shock: Secondary | ICD-10-CM

## 2016-11-26 DIAGNOSIS — R0989 Other specified symptoms and signs involving the circulatory and respiratory systems: Secondary | ICD-10-CM

## 2016-11-26 DIAGNOSIS — I10 Essential (primary) hypertension: Secondary | ICD-10-CM | POA: Diagnosis present

## 2016-11-26 DIAGNOSIS — G9341 Metabolic encephalopathy: Secondary | ICD-10-CM

## 2016-11-26 DIAGNOSIS — J449 Chronic obstructive pulmonary disease, unspecified: Secondary | ICD-10-CM | POA: Diagnosis present

## 2016-11-26 DIAGNOSIS — Z794 Long term (current) use of insulin: Secondary | ICD-10-CM

## 2016-11-26 DIAGNOSIS — G8190 Hemiplegia, unspecified affecting unspecified side: Secondary | ICD-10-CM | POA: Diagnosis present

## 2016-11-26 DIAGNOSIS — E872 Acidosis: Secondary | ICD-10-CM | POA: Diagnosis present

## 2016-11-26 DIAGNOSIS — R4182 Altered mental status, unspecified: Secondary | ICD-10-CM | POA: Diagnosis present

## 2016-11-26 DIAGNOSIS — J9620 Acute and chronic respiratory failure, unspecified whether with hypoxia or hypercapnia: Secondary | ICD-10-CM

## 2016-11-26 DIAGNOSIS — E875 Hyperkalemia: Secondary | ICD-10-CM | POA: Diagnosis not present

## 2016-11-26 DIAGNOSIS — A419 Sepsis, unspecified organism: Secondary | ICD-10-CM | POA: Diagnosis not present

## 2016-11-26 DIAGNOSIS — J9621 Acute and chronic respiratory failure with hypoxia: Secondary | ICD-10-CM | POA: Diagnosis present

## 2016-11-26 DIAGNOSIS — Z7982 Long term (current) use of aspirin: Secondary | ICD-10-CM

## 2016-11-26 DIAGNOSIS — R7989 Other specified abnormal findings of blood chemistry: Secondary | ICD-10-CM | POA: Diagnosis not present

## 2016-11-26 DIAGNOSIS — Z8673 Personal history of transient ischemic attack (TIA), and cerebral infarction without residual deficits: Secondary | ICD-10-CM

## 2016-11-26 DIAGNOSIS — F0390 Unspecified dementia without behavioral disturbance: Secondary | ICD-10-CM | POA: Diagnosis present

## 2016-11-26 DIAGNOSIS — Z7189 Other specified counseling: Secondary | ICD-10-CM | POA: Diagnosis not present

## 2016-11-26 DIAGNOSIS — R7881 Bacteremia: Secondary | ICD-10-CM | POA: Diagnosis not present

## 2016-11-26 DIAGNOSIS — Z79899 Other long term (current) drug therapy: Secondary | ICD-10-CM

## 2016-11-26 DIAGNOSIS — R739 Hyperglycemia, unspecified: Secondary | ICD-10-CM | POA: Diagnosis present

## 2016-11-26 LAB — I-STAT ARTERIAL BLOOD GAS, ED
Acid-base deficit: 3 mmol/L — ABNORMAL HIGH (ref 0.0–2.0)
Bicarbonate: 20.2 mmol/L (ref 20.0–28.0)
O2 Saturation: 98 %
PCO2 ART: 29.5 mmHg — AB (ref 32.0–48.0)
TCO2: 21 mmol/L (ref 0–100)
pH, Arterial: 7.445 (ref 7.350–7.450)
pO2, Arterial: 98 mmHg (ref 83.0–108.0)

## 2016-11-26 LAB — BLOOD CULTURE ID PANEL (REFLEXED)
ACINETOBACTER BAUMANNII: NOT DETECTED
CANDIDA GLABRATA: NOT DETECTED
CANDIDA KRUSEI: NOT DETECTED
CANDIDA PARAPSILOSIS: NOT DETECTED
Candida albicans: NOT DETECTED
Candida tropicalis: NOT DETECTED
ENTEROBACTER CLOACAE COMPLEX: NOT DETECTED
ENTEROCOCCUS SPECIES: NOT DETECTED
ESCHERICHIA COLI: NOT DETECTED
Enterobacteriaceae species: NOT DETECTED
Haemophilus influenzae: NOT DETECTED
KLEBSIELLA OXYTOCA: NOT DETECTED
Klebsiella pneumoniae: NOT DETECTED
LISTERIA MONOCYTOGENES: NOT DETECTED
Neisseria meningitidis: NOT DETECTED
PSEUDOMONAS AERUGINOSA: NOT DETECTED
Proteus species: NOT DETECTED
STREPTOCOCCUS PNEUMONIAE: NOT DETECTED
STREPTOCOCCUS PYOGENES: NOT DETECTED
Serratia marcescens: NOT DETECTED
Staphylococcus aureus (BCID): NOT DETECTED
Staphylococcus species: NOT DETECTED
Streptococcus agalactiae: DETECTED — AB
Streptococcus species: DETECTED — AB

## 2016-11-26 LAB — BASIC METABOLIC PANEL
Anion gap: 10 (ref 5–15)
Anion gap: 11 (ref 5–15)
BUN: 128 mg/dL — AB (ref 6–20)
BUN: 119 mg/dL — AB (ref 6–20)
CALCIUM: 8.8 mg/dL — AB (ref 8.9–10.3)
CALCIUM: 9.2 mg/dL (ref 8.9–10.3)
CO2: 17 mmol/L — ABNORMAL LOW (ref 22–32)
CO2: 20 mmol/L — ABNORMAL LOW (ref 22–32)
CREATININE: 2.28 mg/dL — AB (ref 0.44–1.00)
Chloride: 129 mmol/L — ABNORMAL HIGH (ref 101–111)
Chloride: 128 mmol/L — ABNORMAL HIGH (ref 101–111)
Creatinine, Ser: 2.58 mg/dL — ABNORMAL HIGH (ref 0.44–1.00)
GFR calc non Af Amer: 16 mL/min — ABNORMAL LOW (ref >60)
GFR calc non Af Amer: 19 mL/min — ABNORMAL LOW (ref >60)
GFR, EST AFRICAN AMERICAN: 19 mL/min — AB (ref >60)
GFR, EST AFRICAN AMERICAN: 22 mL/min — AB (ref >60)
Glucose, Bld: 401 mg/dL — ABNORMAL HIGH (ref 65–99)
Glucose, Bld: 217 mg/dL — ABNORMAL HIGH (ref 65–99)
Potassium: 2.8 mmol/L — ABNORMAL LOW (ref 3.5–5.1)
Potassium: 3.2 mmol/L — ABNORMAL LOW (ref 3.5–5.1)
SODIUM: 156 mmol/L — AB (ref 135–145)
SODIUM: 159 mmol/L — AB (ref 135–145)

## 2016-11-26 LAB — COMPREHENSIVE METABOLIC PANEL
ALK PHOS: 279 U/L — AB (ref 38–126)
ALT: 48 U/L (ref 14–54)
AST: 50 U/L — AB (ref 15–41)
Albumin: 2.2 g/dL — ABNORMAL LOW (ref 3.5–5.0)
Anion gap: 18 — ABNORMAL HIGH (ref 5–15)
BUN: 141 mg/dL — ABNORMAL HIGH (ref 6–20)
CALCIUM: 9.4 mg/dL (ref 8.9–10.3)
CHLORIDE: 121 mmol/L — AB (ref 101–111)
CO2: 17 mmol/L — ABNORMAL LOW (ref 22–32)
Creatinine, Ser: 3.02 mg/dL — ABNORMAL HIGH (ref 0.44–1.00)
GFR calc Af Amer: 15 mL/min — ABNORMAL LOW (ref >60)
GFR, EST NON AFRICAN AMERICAN: 13 mL/min — AB (ref >60)
Glucose, Bld: 553 mg/dL (ref 65–99)
Potassium: 3.6 mmol/L (ref 3.5–5.1)
Sodium: 156 mmol/L — ABNORMAL HIGH (ref 135–145)
Total Bilirubin: 0.7 mg/dL (ref 0.3–1.2)
Total Protein: 7.4 g/dL (ref 6.5–8.1)

## 2016-11-26 LAB — CBC WITH DIFFERENTIAL/PLATELET
BASOS PCT: 0 %
Basophils Absolute: 0 10*3/uL (ref 0.0–0.1)
Basophils Absolute: 0 10*3/uL (ref 0.0–0.1)
Basophils Relative: 0 %
EOS ABS: 0 10*3/uL (ref 0.0–0.7)
Eosinophils Absolute: 0 10*3/uL (ref 0.0–0.7)
Eosinophils Relative: 0 %
Eosinophils Relative: 0 %
HCT: 17.5 % — ABNORMAL LOW (ref 36.0–46.0)
HEMATOCRIT: 31.5 % — AB (ref 36.0–46.0)
HEMOGLOBIN: 9.3 g/dL — AB (ref 12.0–15.0)
Hemoglobin: 5.3 g/dL — CL (ref 12.0–15.0)
LYMPHS ABS: 1.2 10*3/uL (ref 0.7–4.0)
LYMPHS PCT: 6 %
Lymphocytes Relative: 4 %
Lymphs Abs: 0.4 10*3/uL — ABNORMAL LOW (ref 0.7–4.0)
MCH: 33.1 pg (ref 26.0–34.0)
MCH: 32.1 pg (ref 26.0–34.0)
MCHC: 30.3 g/dL (ref 30.0–36.0)
MCHC: 29.5 g/dL — AB (ref 30.0–36.0)
MCV: 109.4 fL — AB (ref 78.0–100.0)
MCV: 108.6 fL — ABNORMAL HIGH (ref 78.0–100.0)
MONOS PCT: 5 %
Monocytes Absolute: 0.9 10*3/uL (ref 0.1–1.0)
Monocytes Absolute: 1 10*3/uL (ref 0.1–1.0)
Monocytes Relative: 10 %
NEUTROS ABS: 18.2 10*3/uL — AB (ref 1.7–7.7)
NEUTROS PCT: 86 %
Neutro Abs: 7.8 10*3/uL — ABNORMAL HIGH (ref 1.7–7.7)
Neutrophils Relative %: 89 %
PLATELETS: 140 10*3/uL — AB (ref 150–400)
Platelets: 300 10*3/uL (ref 150–400)
RBC: 1.6 MIL/uL — AB (ref 3.87–5.11)
RBC: 2.9 MIL/uL — ABNORMAL LOW (ref 3.87–5.11)
RDW: 17.4 % — AB (ref 11.5–15.5)
RDW: 17.3 % — ABNORMAL HIGH (ref 11.5–15.5)
WBC Morphology: INCREASED
WBC: 9.1 10*3/uL (ref 4.0–10.5)
WBC: 20.4 10*3/uL — ABNORMAL HIGH (ref 4.0–10.5)

## 2016-11-26 LAB — INFLUENZA PANEL BY PCR (TYPE A & B)
INFLAPCR: NEGATIVE
INFLBPCR: NEGATIVE

## 2016-11-26 LAB — CBC
HEMATOCRIT: 28.4 % — AB (ref 36.0–46.0)
Hemoglobin: 8.6 g/dL — ABNORMAL LOW (ref 12.0–15.0)
MCH: 32.1 pg (ref 26.0–34.0)
MCHC: 30.3 g/dL (ref 30.0–36.0)
MCV: 106 fL — ABNORMAL HIGH (ref 78.0–100.0)
PLATELETS: 279 10*3/uL (ref 150–400)
RBC: 2.68 MIL/uL — AB (ref 3.87–5.11)
RDW: 16.9 % — AB (ref 11.5–15.5)
WBC: 21.6 10*3/uL — AB (ref 4.0–10.5)

## 2016-11-26 LAB — URINALYSIS, ROUTINE W REFLEX MICROSCOPIC
BACTERIA UA: NONE SEEN
Bilirubin Urine: NEGATIVE
Glucose, UA: >=500 mg/dL — AB
HGB URINE DIPSTICK: NEGATIVE
Ketones, ur: NEGATIVE mg/dL
Nitrite: NEGATIVE
PROTEIN: NEGATIVE mg/dL
SPECIFIC GRAVITY, URINE: 1.014 (ref 1.005–1.030)
pH: 5 (ref 5.0–8.0)

## 2016-11-26 LAB — GLUCOSE, CAPILLARY
GLUCOSE-CAPILLARY: 242 mg/dL — AB (ref 65–99)
GLUCOSE-CAPILLARY: 213 mg/dL — AB (ref 65–99)
Glucose-Capillary: 143 mg/dL — ABNORMAL HIGH (ref 65–99)
Glucose-Capillary: 158 mg/dL — ABNORMAL HIGH (ref 65–99)
Glucose-Capillary: 174 mg/dL — ABNORMAL HIGH (ref 65–99)
Glucose-Capillary: 187 mg/dL — ABNORMAL HIGH (ref 65–99)
Glucose-Capillary: 241 mg/dL — ABNORMAL HIGH (ref 65–99)
Glucose-Capillary: 292 mg/dL — ABNORMAL HIGH (ref 65–99)

## 2016-11-26 LAB — CBG MONITORING, ED
GLUCOSE-CAPILLARY: 565 mg/dL — AB (ref 65–99)
GLUCOSE-CAPILLARY: 578 mg/dL — AB (ref 65–99)
GLUCOSE-CAPILLARY: 512 mg/dL — AB (ref 65–99)
GLUCOSE-CAPILLARY: 417 mg/dL — AB (ref 65–99)

## 2016-11-26 LAB — I-STAT CHEM 8, ED
BUN: 121 mg/dL — AB (ref 6–20)
CHLORIDE: 127 mmol/L — AB (ref 101–111)
Calcium, Ion: 1.19 mmol/L (ref 1.15–1.40)
Creatinine, Ser: 2.4 mg/dL — ABNORMAL HIGH (ref 0.44–1.00)
Glucose, Bld: 524 mg/dL (ref 65–99)
HEMATOCRIT: 28 % — AB (ref 36.0–46.0)
Hemoglobin: 9.5 g/dL — ABNORMAL LOW (ref 12.0–15.0)
Potassium: 3.8 mmol/L (ref 3.5–5.1)
SODIUM: 159 mmol/L — AB (ref 135–145)
TCO2: 21 mmol/L (ref 0–100)

## 2016-11-26 LAB — CREATININE, SERUM
Creatinine, Ser: 2.53 mg/dL — ABNORMAL HIGH (ref 0.44–1.00)
GFR, EST AFRICAN AMERICAN: 19 mL/min — AB (ref >60)
GFR, EST NON AFRICAN AMERICAN: 16 mL/min — AB (ref >60)

## 2016-11-26 LAB — LACTIC ACID, PLASMA: Lactic Acid, Venous: 2.6 mmol/L (ref 0.5–1.9)

## 2016-11-26 LAB — TROPONIN I
TROPONIN I: 0.37 ng/mL — AB (ref <0.03)
Troponin I: 0.52 ng/mL (ref <0.03)
Troponin I: 0.33 ng/mL (ref <0.03)

## 2016-11-26 LAB — POC OCCULT BLOOD, ED: Fecal Occult Bld: NEGATIVE

## 2016-11-26 LAB — PREPARE RBC (CROSSMATCH)

## 2016-11-26 LAB — SODIUM, URINE, RANDOM: SODIUM UR: 17 mmol/L

## 2016-11-26 LAB — I-STAT CG4 LACTIC ACID, ED
LACTIC ACID, VENOUS: 1.78 mmol/L (ref 0.5–1.9)
Lactic Acid, Venous: 4.02 mmol/L (ref 0.5–1.9)

## 2016-11-26 LAB — BRAIN NATRIURETIC PEPTIDE
B NATRIURETIC PEPTIDE 5: 399 pg/mL — AB (ref 0.0–100.0)
B Natriuretic Peptide: 434.6 pg/mL — ABNORMAL HIGH (ref 0.0–100.0)

## 2016-11-26 LAB — MRSA PCR SCREENING: MRSA BY PCR: POSITIVE — AB

## 2016-11-26 MED ORDER — ACETAMINOPHEN 650 MG RE SUPP: 650.0000 mg | Freq: Four times a day (QID) | RECTAL | Status: DC | PRN

## 2016-11-26 MED ORDER — CHLORHEXIDINE GLUCONATE 0.12% ORAL RINSE (MEDLINE KIT)
15.0000 mL | Freq: Two times a day (BID) | OROMUCOSAL | Status: DC
  Administered 2016-11-26 – 2016-12-04 (×16): 15 mL via OROMUCOSAL

## 2016-11-26 MED ORDER — SODIUM CHLORIDE 0.9 % IV BOLUS (SEPSIS)
1000.0000 mL | Freq: Once | INTRAVENOUS | Status: AC
  Administered 2016-11-26: 1000 mL via INTRAVENOUS

## 2016-11-26 MED ORDER — OSELTAMIVIR PHOSPHATE 75 MG PO CAPS: 75.0000 mg | ORAL_CAPSULE | Freq: Once | ORAL | Status: DC

## 2016-11-26 MED ORDER — DEXTROSE 5 % IV SOLN
1.0000 g | INTRAVENOUS | Status: DC
  Administered 2016-11-27: 1 g via INTRAVENOUS
  Filled 2016-11-26: qty 1

## 2016-11-26 MED ORDER — FAMOTIDINE 20 MG PO TABS
20.0000 mg | ORAL_TABLET | Freq: Every day | ORAL | Status: DC
  Administered 2016-11-26 – 2016-12-04 (×9): 20 mg via ORAL
  Filled 2016-11-26 (×9): qty 1

## 2016-11-26 MED ORDER — HEPARIN SODIUM (PORCINE) 5000 UNIT/ML IJ SOLN
5000.0000 [IU] | Freq: Three times a day (TID) | INTRAMUSCULAR | Status: DC
  Administered 2016-11-26 – 2016-12-03 (×20): 5000 [IU] via SUBCUTANEOUS
  Filled 2016-11-26 (×19): qty 1

## 2016-11-26 MED ORDER — METHYLPREDNISOLONE SODIUM SUCC 125 MG IJ SOLR
125.0000 mg | Freq: Once | INTRAMUSCULAR | Status: AC
  Administered 2016-11-26: 125 mg via INTRAVENOUS
  Filled 2016-11-26: qty 2

## 2016-11-26 MED ORDER — VANCOMYCIN HCL 500 MG IV SOLR: 500.0000 mg | INTRAVENOUS | Status: DC

## 2016-11-26 MED ORDER — GUAIFENESIN 100 MG/5ML PO SYRP
200.0000 mg | ORAL_SOLUTION | Freq: Three times a day (TID) | ORAL | Status: DC | PRN
  Filled 2016-11-26: qty 10

## 2016-11-26 MED ORDER — ALBUTEROL (5 MG/ML) CONTINUOUS INHALATION SOLN
10.0000 mg/h | INHALATION_SOLUTION | Freq: Once | RESPIRATORY_TRACT | Status: AC
  Administered 2016-11-26: 10 mg/h via RESPIRATORY_TRACT
  Filled 2016-11-26: qty 20

## 2016-11-26 MED ORDER — ATORVASTATIN CALCIUM 40 MG PO TABS
40.0000 mg | ORAL_TABLET | Freq: Every day | ORAL | Status: DC
  Administered 2016-11-26 – 2016-12-04 (×9): 40 mg via ORAL
  Filled 2016-11-26 (×9): qty 1

## 2016-11-26 MED ORDER — SODIUM CHLORIDE 0.9 % IV BOLUS (SEPSIS)
500.0000 mL | Freq: Once | INTRAVENOUS | Status: AC
  Administered 2016-11-26: 500 mL via INTRAVENOUS

## 2016-11-26 MED ORDER — AMANTADINE HCL 100 MG PO CAPS
100.0000 mg | ORAL_CAPSULE | Freq: Every day | ORAL | Status: DC
  Administered 2016-11-26 – 2016-12-04 (×9): 100 mg via ORAL
  Filled 2016-11-26 (×9): qty 1

## 2016-11-26 MED ORDER — SODIUM CHLORIDE 0.9 % IV SOLN
INTRAVENOUS | Status: DC
  Administered 2016-11-26: 5.1 [IU]/h via INTRAVENOUS
  Filled 2016-11-26: qty 2.5

## 2016-11-26 MED ORDER — MUPIROCIN 2 % EX OINT
1.0000 "application " | TOPICAL_OINTMENT | Freq: Two times a day (BID) | CUTANEOUS | Status: AC
  Administered 2016-11-26 – 2016-12-01 (×10): 1 via NASAL
  Filled 2016-11-26 (×3): qty 22

## 2016-11-26 MED ORDER — CHLORHEXIDINE GLUCONATE CLOTH 2 % EX PADS
6.0000 | MEDICATED_PAD | Freq: Every day | CUTANEOUS | Status: AC
  Administered 2016-11-27 – 2016-12-01 (×5): 6 via TOPICAL

## 2016-11-26 MED ORDER — PANTOPRAZOLE SODIUM 40 MG IV SOLR
40.0000 mg | Freq: Once | INTRAVENOUS | Status: AC
  Administered 2016-11-26: 40 mg via INTRAVENOUS
  Filled 2016-11-26: qty 40

## 2016-11-26 MED ORDER — ASPIRIN 81 MG PO CHEW
81.0000 mg | CHEWABLE_TABLET | Freq: Every day | ORAL | Status: DC
  Administered 2016-11-26 – 2016-12-04 (×9): 81 mg via ORAL
  Filled 2016-11-26 (×9): qty 1

## 2016-11-26 MED ORDER — IPRATROPIUM BROMIDE 0.02 % IN SOLN
0.5000 mg | Freq: Once | RESPIRATORY_TRACT | Status: AC
  Administered 2016-11-26: 0.5 mg via RESPIRATORY_TRACT
  Filled 2016-11-26: qty 2.5

## 2016-11-26 MED ORDER — ACETAMINOPHEN 325 MG PO TABS: 650.0000 mg | ORAL_TABLET | Freq: Four times a day (QID) | ORAL | Status: DC | PRN

## 2016-11-26 MED ORDER — FENTANYL 12 MCG/HR TD PT72
12.5000 ug | MEDICATED_PATCH | TRANSDERMAL | Status: DC
  Administered 2016-11-26 – 2016-12-02 (×3): 12.5 ug via TRANSDERMAL
  Filled 2016-11-26 (×3): qty 1

## 2016-11-26 MED ORDER — OSELTAMIVIR PHOSPHATE 6 MG/ML PO SUSR
75.0000 mg | Freq: Once | ORAL | Status: AC
  Administered 2016-11-26: 75 mg
  Filled 2016-11-26: qty 12.5

## 2016-11-26 MED ORDER — CEFEPIME HCL 2 G IJ SOLR
2.0000 g | Freq: Once | INTRAMUSCULAR | Status: AC
  Administered 2016-11-26: 2 g via INTRAVENOUS
  Filled 2016-11-26: qty 2

## 2016-11-26 MED ORDER — IPRATROPIUM-ALBUTEROL 0.5-2.5 (3) MG/3ML IN SOLN
3.0000 mL | Freq: Three times a day (TID) | RESPIRATORY_TRACT | Status: DC | PRN
  Administered 2016-11-30 – 2016-12-01 (×4): 3 mL via RESPIRATORY_TRACT
  Filled 2016-11-26 (×3): qty 3

## 2016-11-26 MED ORDER — DOCUSATE SODIUM 50 MG/5ML PO LIQD
100.0000 mg | Freq: Two times a day (BID) | ORAL | Status: DC
  Administered 2016-11-26 – 2016-12-04 (×15): 100 mg via ORAL
  Filled 2016-11-26 (×16): qty 10

## 2016-11-26 MED ORDER — FERROUS SULFATE 300 (60 FE) MG/5ML PO SYRP
300.0000 mg | ORAL_SOLUTION | Freq: Two times a day (BID) | ORAL | Status: DC
  Administered 2016-11-26 – 2016-12-04 (×17): 300 mg via ORAL
  Filled 2016-11-26 (×18): qty 5

## 2016-11-26 MED ORDER — INSULIN ASPART 100 UNIT/ML ~~LOC~~ SOLN: 10.0000 [IU] | Freq: Once | SUBCUTANEOUS | Status: DC

## 2016-11-26 MED ORDER — LEVOTHYROXINE SODIUM 25 MCG PO TABS
25.0000 ug | ORAL_TABLET | Freq: Every day | ORAL | Status: DC
  Administered 2016-11-27 – 2016-12-04 (×8): 25 ug via ORAL
  Filled 2016-11-26 (×8): qty 1

## 2016-11-26 MED ORDER — HYDROCODONE-ACETAMINOPHEN 5-325 MG PO TABS
1.0000 | ORAL_TABLET | ORAL | Status: DC | PRN
  Administered 2016-11-26: 2 via ORAL
  Filled 2016-11-26: qty 1
  Filled 2016-11-26: qty 2

## 2016-11-26 MED ORDER — AMLODIPINE BESYLATE 5 MG PO TABS
10.0000 mg | ORAL_TABLET | Freq: Every day | ORAL | Status: DC
  Administered 2016-11-26 – 2016-11-28 (×3): 10 mg via ORAL
  Filled 2016-11-26 (×4): qty 2

## 2016-11-26 MED ORDER — DEXTROSE-NACL 5-0.45 % IV SOLN
INTRAVENOUS | Status: DC
  Administered 2016-11-26: 20:00:00 via INTRAVENOUS

## 2016-11-26 MED ORDER — ORAL CARE MOUTH RINSE
15.0000 mL | Freq: Four times a day (QID) | OROMUCOSAL | Status: DC
  Administered 2016-11-26 – 2016-12-04 (×32): 15 mL via OROMUCOSAL

## 2016-11-26 MED ORDER — DEXTROSE 5 % IV SOLN
500.0000 mg | INTRAVENOUS | Status: DC
  Filled 2016-11-26: qty 0.5

## 2016-11-26 MED ORDER — SERTRALINE HCL 100 MG PO TABS
100.0000 mg | ORAL_TABLET | Freq: Every day | ORAL | Status: DC
  Administered 2016-11-26 – 2016-12-03 (×8): 100 mg via ORAL
  Filled 2016-11-26 (×8): qty 1

## 2016-11-26 MED ORDER — SODIUM CHLORIDE 0.9 % IV SOLN: 10.0000 mL/h | Freq: Once | INTRAVENOUS | Status: DC

## 2016-11-26 MED ORDER — VANCOMYCIN HCL IN DEXTROSE 1-5 GM/200ML-% IV SOLN
1000.0000 mg | Freq: Once | INTRAVENOUS | Status: AC
  Administered 2016-11-26: 1000 mg via INTRAVENOUS
  Filled 2016-11-26: qty 200

## 2016-11-26 MED ORDER — GLUCERNA 1.2 CAL PO LIQD
1000.0000 mL | ORAL | Status: DC
  Administered 2016-11-26 – 2016-12-04 (×9): 1000 mL
  Filled 2016-11-26 (×12): qty 1000

## 2016-11-26 MED ORDER — SODIUM CHLORIDE 0.45 % NICU IV INFUSION SIMPLE
INJECTION | INTRAVENOUS | Status: DC
  Filled 2016-11-26 (×5): qty 500

## 2016-11-26 MED ORDER — FREE WATER
200.0000 mL | Status: DC
  Administered 2016-11-26 – 2016-11-29 (×15): 200 mL

## 2016-11-26 NOTE — ED Notes (Signed)
Nurse starting 2nd IV and will get labs. 

## 2016-11-26 NOTE — ED Triage Notes (Signed)
[facility]  Pt unresponsive, timeframe unknown, GCS of 3, trach patient, chronic pneumonia, increased secretions in trach.  Pt febrile in 100s at nursing home, got a duoneb at midnight.  CBG 551.  HR 120s.  102/56 blood pressure.  Placed pt on 5L after she went to 90%.  Pt went apneic in truck for 7 minutes.  Bagging patient on arrival.  Last dx of pneumonia in December.

## 2016-11-26 NOTE — ED Notes (Signed)
Attempted report. RN states pt doesn't have correct room on this floor.

## 2016-11-26 NOTE — Progress Notes (Signed)
PHARMACY - PHYSICIAN COMMUNICATION CRITICAL VALUE ALERT - BLOOD CULTURE IDENTIFICATION (BCID)  Results for orders placed or performed during the hospital encounter of 11/26/16  Blood Culture ID Panel (Reflexed) (Collected: 11/26/2016  6:00 AM)  Result Value Ref Range   Enterococcus species NOT DETECTED NOT DETECTED   Listeria monocytogenes NOT DETECTED NOT DETECTED   Staphylococcus species NOT DETECTED NOT DETECTED   Staphylococcus aureus NOT DETECTED NOT DETECTED   Streptococcus species DETECTED (A) NOT DETECTED   Streptococcus agalactiae DETECTED (A) NOT DETECTED   Streptococcus pneumoniae NOT DETECTED NOT DETECTED   Streptococcus pyogenes NOT DETECTED NOT DETECTED   Acinetobacter baumannii NOT DETECTED NOT DETECTED   Enterobacteriaceae species NOT DETECTED NOT DETECTED   Enterobacter cloacae complex NOT DETECTED NOT DETECTED   Escherichia coli NOT DETECTED NOT DETECTED   Klebsiella oxytoca NOT DETECTED NOT DETECTED   Klebsiella pneumoniae NOT DETECTED NOT DETECTED   Proteus species NOT DETECTED NOT DETECTED   Serratia marcescens NOT DETECTED NOT DETECTED   Haemophilus influenzae NOT DETECTED NOT DETECTED   Neisseria meningitidis NOT DETECTED NOT DETECTED   Pseudomonas aeruginosa NOT DETECTED NOT DETECTED   Candida albicans NOT DETECTED NOT DETECTED   Candida glabrata NOT DETECTED NOT DETECTED   Candida krusei NOT DETECTED NOT DETECTED   Candida parapsilosis NOT DETECTED NOT DETECTED   Candida tropicalis NOT DETECTED NOT DETECTED    Name of physician (or Provider) Contacted: Hugelmeyer   Changes to prescribed antibiotics required: none, at present as covered broadly with cefepime and vancomycin. Will empirically adjust Cefepime to 1 gram IV every 24 hours with improvement in renal function over last 12 hours.  Pollyann SamplesAndy Camaron Cammack, PharmD, BCPS 11/26/2016, 9:19 PM

## 2016-11-26 NOTE — Procedures (Signed)
Pt transported from ED to 4E25 on vent, no complications

## 2016-11-26 NOTE — ED Notes (Signed)
Attempted report 

## 2016-11-26 NOTE — ED Provider Notes (Signed)
  Physical Exam  BP 114/62   Pulse 100   Temp 98.1 F (36.7 C) (Rectal)   Resp (!) 28   Ht 5' (1.524 m)   Wt 98 lb 8.7 oz (44.7 kg)   SpO2 100%   BMI 19.25 kg/m   Physical Exam  ED Course  Procedures  MDM Received patient in signout. May have been febrile at nursing home. Has elevated white count. No clear source of infection however. Urine and chest x-ray reassuring. Flu pending. Has hyperglycemia. Initial set of lab work was an error and likely drawn above an IV site. Does have some renal insufficiency. Mild anemia. Hyperglycemia with potentially mild DKA. Given antibiotics empirically but at this point it is not definitely diagnosis of septic shock. Will admit to stepdown bed to internal medicine.       Benjiman CoreNathan Jakerra Floyd, MD 11/26/16 1150

## 2016-11-26 NOTE — Progress Notes (Signed)
CRITICAL VALUE ALERT  Critical value received:  Lactic Acid, Venous  (2.6)  Date of notification:  11/26/2016  Time of notification:  1858  Critical value read back:Yes.    Nurse who received alert:  Lovie MacadamiaNadine Issabella Rix RN  MD notified (1st page):  A. Hugelmeyer  Time of first page:  1920  MD notified (2nd page):  Time of second page:  Responding MD:   Rn text via Amion  Time MD responded:  Did not get a response A Hugelmeyer was made aware

## 2016-11-26 NOTE — ED Notes (Addendum)
Myself and Donnamae JudeKeshia, RN changed patient's stretcher linens along with her diaper; washed patient's face and buttocks; placed clean linens on stretcher and changed patient's pillowcase, placed pillow underneath patient's head, placed a towel across patient's chest; placed 2 chuks underneath patient's buttocks and covered patient with 2 warm blankets; patient resting at this time

## 2016-11-26 NOTE — ED Notes (Signed)
RTs at bedside

## 2016-11-26 NOTE — H&P (Signed)
History and Physical    Evelyn FamHazetta L Murton WUJ:[id number] DOB: 1930-07-10 DOA: 11/26/2016  PCP: Pcp Not In System Patient coming from:  SNF  Chief Complaint: AMS  HPI: Evelyn [name] is a 83 y.o. female with medical history significant of hypertension, CVA, COPD with chronic respiratory failure with hypoxia, status post try tracheostomy, status post gastrostomy, anemia, previous history of acute kidney injury and hypernatremia during the last hospitalization in December 2017 who presented to them emergency department with altered mental status and respiratory distress. She is  A resident of the SNF and at baseline is not verbal, but during the last 24 hours she became less responsive and her her breathing became more labored. EMS was summoned to the facility and patient was brought to the ED. EMS reported that en route patient became apneic and required  assistance with bag ventilation.  ED Course: on arrival tot he Ed she was afebrile, tachypneic, tachycardic, chest xray was negative for acute findings, UA had significant number of white blood cells count which was started of probable UTI. Blood work revealed elevated white blood cells count of 20,400, hemoglobin 9.3 creatinine elevated to 3.02 in p.m. 141 sodium was 156, glucose 533 and lactic acid 4.02 Influenza PCR is pending  Review of Systems: As per HPI otherwise 10 point review of systems negative.   Ambulatory Status: Bed debridement  Past Medical History:  Diagnosis Date  . Arthritis   . Cognitive communication deficit   . COPD (chronic obstructive pulmonary disease) (HCC)   . Dysphagia   . Encephalopathy   . Flaccid hemiplegia (HCC)   . Hyperlipidemia   . Hypertension   . Iron deficiency anemia   . Stroke (HCC)   . Thyroid disease     Past Surgical History:  Procedure Laterality Date  . IR GENERIC HISTORICAL  08/16/2016   IR GASTROSTOMY TUBE MOD SED 08/16/2016 Malachy MoanHeath McCullough, MD [facility]  . TRACHEOSTOMY  TUBE PLACEMENT N/A 08/11/2016   Procedure: TRACHEOSTOMY;  Surgeon: Drema Halonhristopher E Newman, MD;  [facility];  Service: ENT;  Laterality: N/A;    Social History   Social History  . Marital status: Widowed    Spouse name: N/A  . Number of children: N/A  . Years of education: N/A   Occupational History  . Not on file.   Social History Main Topics  . Smoking status: Unknown If Ever Smoked  . Smokeless tobacco: Not on file  . Alcohol use Not on file  . Drug use: Unknown  . Sexual activity: Not on file   Other Topics Concern  . Not on file   Social History Narrative  . No narrative on file    Allergies  Allergen Reactions  . Erythromycin Itching    No family history on file.  Prior to Admission medications   Medication Sig Start Date End Date Taking? Authorizing Provider  amantadine (SYMMETREL) 100 MG capsule Take 100 mg by mouth daily.    Historical Provider, MD  amLODipine (NORVASC) 10 MG tablet Take 10 mg by mouth daily.    Historical Provider, MD  aspirin 81 MG chewable tablet Chew 81 mg by mouth daily.    Historical Provider, MD  atorvastatin (LIPITOR) 40 MG tablet Take 40 mg by mouth daily.    Historical Provider, MD  docusate (COLACE) 50 MG/5ML liquid Take 100 mg by mouth 2 (two) times daily.    Historical Provider, MD  famotidine (PEPCID) 20 MG tablet Take 20 mg by mouth daily.  Historical Provider, MD  fentaNYL (DURAGESIC - DOSED MCG/HR) 12 MCG/HR Place 12.5 mcg onto the skin every 3 (three) days.    Historical Provider, MD  ferrous sulfate 300 (60 Fe) MG/5ML syrup Take 300 mg by mouth 2 (two) times daily with a meal.    Historical Provider, MD  guaifenesin (ROBITUSSIN) 100 MG/5ML syrup Take 200 mg by mouth every 8 (eight) hours as needed for congestion.    Historical Provider, MD  insulin aspart (NOVOLOG) 100 UNIT/ML injection Inject 10 Units into the skin once.    Historical Provider, MD  ipratropium-albuterol (DUONEB) 0.5-2.5 (3) MG/3ML SOLN Take 3 mLs by  nebulization every 8 (eight) hours as needed (wheezing).    Historical Provider, MD  levothyroxine (SYNTHROID, LEVOTHROID) 25 MCG tablet Take 25 mcg by mouth daily before breakfast.    Historical Provider, MD  Multiple Vitamin (MULTIVITAMIN WITH MINERALS) TABS tablet Take 1 tablet by mouth daily.    Historical Provider, MD  Nutritional Supplements (FEEDING SUPPLEMENT, GLUCERNA 1.2 CAL,) LIQD Place 1,000 mLs into feeding tube continuous. 10/24/16   Arnetha Courser, MD  sertraline (ZOLOFT) 100 MG tablet Take 100 mg by mouth at bedtime.    Historical Provider, MD  Water For Irrigation, Sterile (FREE WATER) SOLN Place 200 mLs into feeding tube every 4 (four) hours. 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 No JVD, carotid bruits. No LE edema.  Respiratory: bilateral no wheezes, coarse breath sound, tracheostomy in place No trach site infection noticed Abdomen: PEG site is free of signs of infection, abdomen soft, non-tender, non-distended, no organomegaly or masses appreciated. BS present in all quadrants Skin: no rash, ulcers or induration seen on limited exam Musculoskeletal: grossly normal tone BUE/BLE, good ROM, no bony abnormality or joint deformities observed Psychiatric: unable to assess as patient is unresponsive Neurologic: unable to sees as patient is unresponsive  Labs on Admission: I have personally reviewed following labs and imaging studies  CBC, BMP  GFR: Estimated  Creatinine Clearance: 12.3 mL/min (by C-G formula based on SCr of 2.4 mg/dL (H)).   Creatinine Clearance: Estimated Creatinine Clearance: 12.3 mL/min (by C-G formula based on SCr of 2.4 mg/dL (H)).    Radiological Exams on Admission: Ct Head Wo Contrast  Result Date: 11/26/2016 CLINICAL DATA:  Altered mental status EXAM: CT HEAD WITHOUT CONTRAST TECHNIQUE: Contiguous axial images were obtained from the base of the skull through the vertex without intravenous contrast. COMPARISON:  None. FINDINGS: Brain: Large territory of left MCA infarction. Diffuse hypodensity throughout the left MCA territory including the left basal ganglia. This is most compatible with late subacute or chronic infarction. No prior studies available for comparison. Generalized atrophy. Chronic microvascular ischemic change in the white matter. Negative for hemorrhage. No shift of the midline structures. Vascular: Diffuse arterial calcification.  Negative for dense MCA. Skull: Negative Sinuses/Orbits: Mucosal edema in the  paranasal sinuses. Calcification of the right globe due to chronic insult. Other: None IMPRESSION: Large territory left MCA infarction which appears chronic. Atrophy and chronic ischemia. No acute hemorrhage. Electronically Signed   By: Marlan Palau M.D.   On: 11/26/2016 12:17   Dg Chest Port 1 View  Result Date: 11/26/2016 CLINICAL DATA:  Cough, respiratory distress.  COPD. EXAM: PORTABLE CHEST 1 VIEW COMPARISON:  10/21/2016 FINDINGS: Tracheostomy in good position. Lungs are clear. Negative for pneumonia. Negative for heart failure or effusion. IMPRESSION: No active disease. Electronically Signed   By: Marlan Palau M.D.   On: 11/26/2016 06:47    EKG: Independently reviewed - sinus rhythm, left bundle branch block  Assessment/Plan Principal Problem:   Acute metabolic encephalopathy Active Problems:   AKI (acute kidney injury) (HCC)   Hypertension   COPD (chronic obstructive pulmonary disease)  (HCC)   Hyperlipidemia   Anemia   Hypernatremia   Gastrostomy tube dependent (HCC)   Hyperglycemia   DM (diabetes mellitus) type II controlled peripheral vascular disorder (HCC)   Acute on chronic respiratory failure (HCC)   Acute metabolic encephalopathy - multifactorial, secondary to infection and/or electrolyte derangement and uncontrolled diabetes mellitus Continue IV hydration, treat underlying causative factors Head CT is pending  Sepsis of unknown origin, could be associated with viral respiratory infection, PEG or tracheostomy site infection, also UA was slightly abnormal Blood cultures, urine culture are pending Continue to follow lactic acid progress, monitor white blood cells count Continue broad-spectrum antibiotics Cefepime and vancomycin IV, patient received empirically a dose of Tamiflu  Hypernatremia - recurrent problem, could be associated with dehydration, although patient has a PEG tube and spposed to receive free water boluses Correct Na level slowly with IV half-normal fluid, her water deficit was estimated at 2.6 L roughly Continue follow sodium levels every 6 hours  DM type II with Hyperglycemia - will check HgbA1C  She is on Glucostabilazer  Continue monitor FSBS QACHS and follow protocol  AKI  Follow renal function on  hydration and avoid nephrotoxic drugs She has abnormal troponina dn BNP that could be associaetd with AKI, will recheck the values in the morning    HTN Continue home emds and adjust doses oif needed  Hypothyroidism Continue synthroid   DVT prophylaxis: Heparin Code Status: full Family Communication:  none Disposition Plan: stepdown Consults called: none Admission status: Inpatient   Raymon Mutton, [place] Pager: [phone] [facility]  If 7PM-7AM, please contact night-coverage [email or web address] Password [facility]  11/26/2016, 12:59 PM

## 2016-11-26 NOTE — ED Notes (Signed)
Pt received sleeping with hour long neb treatment connected to trac cuff, pt is tachypnic with rate 32.

## 2016-11-26 NOTE — ED Provider Notes (Signed)
[facility]PT Provider Note   CSN: [id number] Arrival date & time: 11/26/16  [id number]     History   Chief Complaint Chief Complaint  Patient presents with  . Altered Mental Status  . Respiratory Distress    HPI Evelyn [name] is a 83 y.o. female. LEVEL 5 CAVEAT FOR UNRESPONSIVE PATIENT HPI 83 year old female with a PMH of Stroke, COPD, HLD, HTN, and chronic respiratory failure s/p trachyostomy,and PEG tube [facility] becoming less responsive and had  worsening dib. Patient is full code per last visit. SNF reported to EMS that pt is less responsive, but at baseline is not verbal. EMS reports that they had to bag patient en route as she went apneic.    Past Medical History:  Diagnosis Date  . Arthritis   . Cognitive communication deficit   . COPD (chronic obstructive pulmonary disease) (HCC)   . Dysphagia   . Encephalopathy   . Flaccid hemiplegia (HCC)   . Hyperlipidemia   . Hypertension   . Iron deficiency anemia   . Stroke (HCC)   . Thyroid disease     Patient Active Problem List   Diagnosis Date Noted  . Pressure injury of skin 10/23/2016  . Goals of care, counseling/discussion   . Palliative care encounter   . AKI (acute kidney injury) (HCC) 10/19/2016  . Hypertension 10/19/2016  . COPD (chronic obstructive pulmonary disease) (HCC) 10/19/2016  . Hyperlipidemia 10/19/2016  . Anemia 10/19/2016  . Hypernatremia 10/19/2016  . Hypokalemia 10/19/2016  . Chronic respiratory failure with hypoxia (HCC) 10/19/2016  . Status post tracheostomy (HCC) 10/19/2016  . Gastrostomy tube dependent (HCC) 10/19/2016    Past Surgical History:  Procedure Laterality Date  . IR GENERIC HISTORICAL  08/16/2016   IR GASTROSTOMY TUBE MOD SED 08/16/2016 Malachy MoanHeath McCullough, MD [facility]  . TRACHEOSTOMY TUBE PLACEMENT N/A 08/11/2016   Procedure: TRACHEOSTOMY;  Surgeon: Drema Halonhristopher E Newman, MD;  [facility];  Service: ENT;  Laterality: N/A;    OB  History    No data available       Home Medications    Prior to Admission medications   Medication Sig Start Date End Date Taking? Authorizing Provider  amantadine (SYMMETREL) 100 MG capsule Take 100 mg by mouth daily.    Historical Provider, MD  amLODipine (NORVASC) 10 MG tablet Take 10 mg by mouth daily.    Historical Provider, MD  aspirin 81 MG chewable tablet Chew 81 mg by mouth daily.    Historical Provider, MD  atorvastatin (LIPITOR) 40 MG tablet Take 40 mg by mouth daily.    Historical Provider, MD  docusate (COLACE) 50 MG/5ML liquid Take 100 mg by mouth 2 (two) times daily.    Historical Provider, MD  famotidine (PEPCID) 20 MG tablet Take 20 mg by mouth daily.    Historical Provider, MD  fentaNYL (DURAGESIC - DOSED MCG/HR) 12 MCG/HR Place 12.5 mcg onto the skin every 3 (three) days.    Historical Provider, MD  ferrous sulfate 300 (60 Fe) MG/5ML syrup Take 300 mg by mouth 2 (two) times daily with a meal.    Historical Provider, MD  guaifenesin (ROBITUSSIN) 100 MG/5ML syrup Take 200 mg by mouth every 8 (eight) hours as needed for congestion.    Historical Provider, MD  insulin aspart (NOVOLOG) 100 UNIT/ML injection Inject 10 Units into the skin once.    Historical Provider, MD  ipratropium-albuterol (DUONEB) 0.5-2.5 (3) MG/3ML SOLN Take 3 mLs by nebulization every 8 (  eight) hours as needed (wheezing).    Historical Provider, MD  levothyroxine (SYNTHROID, LEVOTHROID) 25 MCG tablet Take 25 mcg by mouth daily before breakfast.    Historical Provider, MD  Multiple Vitamin (MULTIVITAMIN WITH MINERALS) TABS tablet Take 1 tablet by mouth daily.    Historical Provider, MD  Nutritional Supplements (FEEDING SUPPLEMENT, GLUCERNA 1.2 CAL,) LIQD Place 1,000 mLs into feeding tube continuous. 10/24/16   Arnetha Courser, MD  sertraline (ZOLOFT) 100 MG tablet Take 100 mg by mouth at bedtime.    Historical Provider, MD  Water For Irrigation, Sterile (FREE WATER) SOLN Place 200 mLs into feeding tube every 4  (four) hours. 10/24/16   Arnetha Courser, MD    Family History No family history on file.  Social History Social History  Substance Use Topics  . Smoking status: Unknown If Ever Smoked  . Smokeless tobacco: Not on file  . Alcohol use Not on file     Allergies   Erythromycin   Review of Systems Review of Systems  Unable to perform ROS: Patient nonverbal     Physical Exam Updated Vital Signs BP 115/63 (BP Location: Right Arm)   Pulse 112   Temp 98.1 F (36.7 C) (Axillary)   Resp (!) 46   Wt 98 lb 8.7 oz (44.7 kg)   SpO2 100%   BMI 19.25 kg/m   Physical Exam  Constitutional: She appears well-developed.  HENT:  Head: Normocephalic and atraumatic.  Neck: Neck supple.  Cardiovascular: Normal rate.   Pulmonary/Chest: She is in respiratory distress.  Abdominal: Bowel sounds are normal.  Neurological:  GCS - 3  Skin: Skin is warm and dry.  Nursing note and vitals reviewed.    ED Treatments / Results  Labs (all labs ordered are listed, but only abnormal results are displayed) Labs Reviewed  CBC WITH DIFFERENTIAL/PLATELET - Abnormal; Notable for the following:       Result Value   RBC 1.60 (*)    Hemoglobin 5.3 (*)    HCT 17.5 (*)    MCV 109.4 (*)    RDW 17.4 (*)    Platelets 140 (*)    Neutro Abs 7.8 (*)    Lymphs Abs 0.4 (*)    All other components within normal limits  I-STAT ARTERIAL BLOOD GAS, ED - Abnormal; Notable for the following:    pCO2 arterial 29.5 (*)    Acid-base deficit 3.0 (*)    All other components within normal limits  I-STAT CG4 LACTIC ACID, ED - Abnormal; Notable for the following:    Lactic Acid, Venous 4.02 (*)    All other components within normal limits  CULTURE, RESPIRATORY (NON-EXPECTORATED)  URINE CULTURE  CULTURE, BLOOD (ROUTINE X 2)  CULTURE, BLOOD (ROUTINE X 2)  COMPREHENSIVE METABOLIC PANEL  TROPONIN I  URINALYSIS, ROUTINE W REFLEX MICROSCOPIC  INFLUENZA PANEL BY PCR (TYPE A & B)  CBC WITH DIFFERENTIAL/PLATELET    TROPONIN I  BRAIN NATRIURETIC PEPTIDE  COMPREHENSIVE METABOLIC PANEL  I-STAT CHEM 8, ED  I-STAT CG4 LACTIC ACID, ED  POC OCCULT BLOOD, ED  I-STAT CHEM 8, ED  I-STAT CHEM 8, ED  TYPE AND SCREEN  PREPARE RBC (CROSSMATCH)    EKG  EKG Interpretation None       Radiology Dg Chest Port 1 View  Result Date: 11/26/2016 CLINICAL DATA:  Cough, respiratory distress.  COPD. EXAM: PORTABLE CHEST 1 VIEW COMPARISON:  10/21/2016 FINDINGS: Tracheostomy in good position. Lungs are clear. Negative for pneumonia. Negative for heart failure or effusion.  IMPRESSION: No active disease. Electronically Signed   By: Marlan Palau M.D.   On: 11/26/2016 06:47    Procedures Procedures (including critical care time)  Medications Ordered in ED Medications  0.9 %  sodium chloride infusion (not administered)  pantoprazole (PROTONIX) injection 40 mg (not administered)  albuterol (PROVENTIL,VENTOLIN) solution continuous neb (10 mg/hr Nebulization Given 11/26/16 0600)  ipratropium (ATROVENT) nebulizer solution 0.5 mg (0.5 mg Nebulization Given 11/26/16 0600)  methylPREDNISolone sodium succinate (SOLU-MEDROL) 125 mg/2 mL injection 125 mg (125 mg Intravenous Given 11/26/16 0625)  sodium chloride 0.9 % bolus 1,000 mL (1,000 mLs Intravenous New Bag/Given 11/26/16 1610)    And  sodium chloride 0.9 % bolus 500 mL (500 mLs Intravenous New Bag/Given 11/26/16 0637)  ceFEPIme (MAXIPIME) 2 g in dextrose 5 % 50 mL IVPB (0 g Intravenous Stopped 11/26/16 0824)  vancomycin (VANCOCIN) IVPB 1000 mg/200 mL premix (1,000 mg Intravenous New Bag/Given 11/26/16 0634)  oseltamivir (TAMIFLU) 6 MG/ML suspension 75 mg (75 mg Per Tube Given 11/26/16 0656)     Initial Impression / Assessment and Plan / ED Course  I have reviewed the triage vital signs and the nursing notes.  Pertinent labs & imaging results that were available during my care of the patient were reviewed by me and considered in my medical decision making (see chart for  details).     Pt comes in unresponsive, and with respiratory distress. Pt is cachectic woman with lots of medical problems, who is s/p trach and peg and full code. She is in respiratory distress, GCS is 3. Clinical suspicion is very high for PNA given that pt is s/p tracheostomy. Influenza screen ordered.  Pt's tracheal aspirate were very thick and yellow.  Initial lab draw results were questioned by the lab- as they felt the blood was too thin. Yt;s possible that the fluid draw was distal to the iv site - repeat labs ordered and lactate is > 4.  Pt placed on vent as she is breathing > 40 times. Dr. Rubin Payor to take over care now and f/u on results.  Code sepsis was called, vanc + cef ordered. 30cc/kg fluid ordered.   Final Clinical Impressions(s) / ED Diagnoses   Final diagnoses:  Acute respiratory disease    New Prescriptions New Prescriptions   No medications on file     Derwood Kaplan, MD 11/26/16 [id number]

## 2016-11-26 NOTE — Progress Notes (Signed)
Pharmacy Antibiotic Note  Evelyn [name] is a 83 y.o. female admitted on 11/26/2016 with sepsis.  Pharmacy has been consulted for vancomycin and cefepime dosing.  Baseline labs resulted and patient has reduce renal clearance (elderly, low body weight).   Plan: - Vanc 500mg  IV Q48H, next dose 11/28/16.  May need to dose per level if renal function doesn't improve. - Cefepime 500mg  IV Q24H, next dose tomorrow - Monitor renal fxn, clinical progress, vanc trough as indicated - F/U with starting free water for hypernatremia   Height: 5' (152.4 cm) Weight: 98 lb 8.7 oz (44.7 kg) IBW/kg (Calculated) : 45.5  Temp (24hrs), Avg:98.1 F (36.7 C), Min:98.1 F (36.7 C), Max:98.1 F (36.7 C)   Recent Labs Lab 11/26/16 0639 11/26/16 0656 11/26/16 0816 11/26/16 0835 11/26/16 0847  WBC 9.1  --   --  20.4*  --   CREATININE  --   --   --  3.02* 2.40*  LATICACIDVEN  --  1.78 4.02*  --   --     Estimated Creatinine Clearance: 12.3 mL/min (by C-G formula based on SCr of 2.4 mg/dL (H)).    Allergies  Allergen Reactions  . Erythromycin Itching    Antimicrobials this admission: Vanc 1/28 >>  Cefepime 1/28 >>   Dose adjustments this admission: n/a  Microbiology results: 1/28 BCx:  1/28 UCx:   1/28 Sputum:     Omunique Pederson D. Laney Potashang, PharmD, BCPS Pager:  [phone] 11/26/2016, 11:27 AM

## 2016-11-26 NOTE — ED Notes (Signed)
Chemistry lab calls stating she is not sure of result and questions contamination. Dr. Elise BenneNanivati made aware and repeat lab work ordered.

## 2016-11-26 NOTE — Progress Notes (Signed)
Pharmacy Antibiotic Note  Evelyn [name] is a 83 y.o. female admitted on 11/26/2016 with sepsis.  Pharmacy has been consulted for vancomycin and cefepime dosing.   Plan: One time doses of vancomycin 1g and Cefepime 2g ordered in the ED - ok to give Follow labs for renal function and maintenance doses Follow c/s, clinical progression, level at SS     Temp (24hrs), Avg:98.1 F (36.7 C), Min:98.1 F (36.7 C), Max:98.1 F (36.7 C)  No results for input(s): WBC, CREATININE, LATICACIDVEN, VANCOTROUGH, VANCOPEAK, VANCORANDOM, GENTTROUGH, GENTPEAK, GENTRANDOM, TOBRATROUGH, TOBRAPEAK, TOBRARND, AMIKACINPEAK, AMIKACINTROU, AMIKACIN in the last 168 hours.  CrCl cannot be calculated (Patient's most recent lab result is older than the maximum 21 days allowed.).    Allergies  Allergen Reactions  . Erythromycin Itching    Antimicrobials this admission: Vancomycin 1/28 >>  Cefepime 1/28 >>   Dose adjustments this admission: n/a  Microbiology results: 1/28 BCx:  1/28 UCx:   1/28 Sputum:     Thank you for allowing pharmacy to be a part of this patient's care.  Kairen Hallinan D. Miyoshi Ligas, PharmD, BCPS Clinical Pharmacist Pager: [phone] 11/26/2016 5:45 AM

## 2016-11-26 NOTE — ED Notes (Addendum)
Assisted Millie, RN with in and out cath to collect an urine sample; Nemiah CommanderMille, RN was successful; drained patient's bladder; performed rectal temperature and Millie, RN obtained a stool sample also; patient cleaned, placed a clean chuk underneath patient and readjusted on stretcher; patient resting at this time

## 2016-11-26 NOTE — Progress Notes (Addendum)
11[facility]/2016 [facility] She came  Form the Emergency room to [facility] on a vent. Rn on [facility] was told patient trach was change to a #4 Shiley cuff. Patient is not alert to person place or time. Patient skin she has a scab are on coccyx and on sacrum old wound. Patient heels are dry. She came from the facility with a peg tube. She also came to [facility] on the Glucostablizer. Elink and Central monitor was made aware patient was here. Unable to do admission because family was not present. [facility] Akanksha Bellmore RN.

## 2016-11-26 NOTE — ED Notes (Signed)
Critical lab results discussed with Dr. Rubin PayorPickering

## 2016-11-26 NOTE — Procedures (Signed)
Trach changed from #4cfls to #4cuffed by 2 RT's.  +ETCo2 color change, =BBS, pt placed on full vent support per MD, pt tolerating well, sputum obtained and sent to lab, RT will monitor

## 2016-11-26 NOTE — ED Notes (Signed)
Istats results noted to Dr. Rubin PayorPickering and will hold PRBC transfusion for now,.

## 2016-11-27 DIAGNOSIS — A401 Sepsis due to streptococcus, group B: Secondary | ICD-10-CM

## 2016-11-27 DIAGNOSIS — G9341 Metabolic encephalopathy: Secondary | ICD-10-CM

## 2016-11-27 DIAGNOSIS — Z881 Allergy status to other antibiotic agents status: Secondary | ICD-10-CM

## 2016-11-27 DIAGNOSIS — Z931 Gastrostomy status: Secondary | ICD-10-CM

## 2016-11-27 DIAGNOSIS — E440 Moderate protein-calorie malnutrition: Secondary | ICD-10-CM | POA: Insufficient documentation

## 2016-11-27 DIAGNOSIS — E860 Dehydration: Secondary | ICD-10-CM

## 2016-11-27 DIAGNOSIS — J9611 Chronic respiratory failure with hypoxia: Secondary | ICD-10-CM

## 2016-11-27 DIAGNOSIS — Z8673 Personal history of transient ischemic attack (TIA), and cerebral infarction without residual deficits: Secondary | ICD-10-CM

## 2016-11-27 DIAGNOSIS — J9621 Acute and chronic respiratory failure with hypoxia: Secondary | ICD-10-CM

## 2016-11-27 DIAGNOSIS — A409 Streptococcal sepsis, unspecified: Secondary | ICD-10-CM

## 2016-11-27 DIAGNOSIS — E870 Hyperosmolality and hypernatremia: Secondary | ICD-10-CM

## 2016-11-27 DIAGNOSIS — Z930 Tracheostomy status: Secondary | ICD-10-CM

## 2016-11-27 DIAGNOSIS — R7881 Bacteremia: Secondary | ICD-10-CM

## 2016-11-27 DIAGNOSIS — R81 Glycosuria: Secondary | ICD-10-CM

## 2016-11-27 LAB — BASIC METABOLIC PANEL
Anion gap: 12 (ref 5–15)
Anion gap: 7 (ref 5–15)
BUN: 112 mg/dL — AB (ref 6–20)
BUN: 109 mg/dL — AB (ref 6–20)
CHLORIDE: 126 mmol/L — AB (ref 101–111)
CO2: 16 mmol/L — ABNORMAL LOW (ref 22–32)
CO2: 18 mmol/L — ABNORMAL LOW (ref 22–32)
CREATININE: 2.06 mg/dL — AB (ref 0.44–1.00)
Calcium: 8.9 mg/dL (ref 8.9–10.3)
Calcium: 8.4 mg/dL — ABNORMAL LOW (ref 8.9–10.3)
Chloride: 125 mmol/L — ABNORMAL HIGH (ref 101–111)
Creatinine, Ser: 2.09 mg/dL — ABNORMAL HIGH (ref 0.44–1.00)
GFR calc Af Amer: 24 mL/min — ABNORMAL LOW (ref >60)
GFR calc Af Amer: 24 mL/min — ABNORMAL LOW (ref >60)
GFR calc non Af Amer: 21 mL/min — ABNORMAL LOW (ref >60)
GFR, EST NON AFRICAN AMERICAN: 21 mL/min — AB (ref >60)
Glucose, Bld: 165 mg/dL — ABNORMAL HIGH (ref 65–99)
Glucose, Bld: 401 mg/dL — ABNORMAL HIGH (ref 65–99)
POTASSIUM: 3.5 mmol/L (ref 3.5–5.1)
Potassium: 5.3 mmol/L — ABNORMAL HIGH (ref 3.5–5.1)
SODIUM: 154 mmol/L — AB (ref 135–145)
SODIUM: 150 mmol/L — AB (ref 135–145)

## 2016-11-27 LAB — GLUCOSE, CAPILLARY
GLUCOSE-CAPILLARY: 166 mg/dL — AB (ref 65–99)
GLUCOSE-CAPILLARY: 335 mg/dL — AB (ref 65–99)
GLUCOSE-CAPILLARY: 371 mg/dL — AB (ref 65–99)
Glucose-Capillary: 156 mg/dL — ABNORMAL HIGH (ref 65–99)
Glucose-Capillary: 280 mg/dL — ABNORMAL HIGH (ref 65–99)
Glucose-Capillary: 137 mg/dL — ABNORMAL HIGH (ref 65–99)
Glucose-Capillary: 223 mg/dL — ABNORMAL HIGH (ref 65–99)

## 2016-11-27 LAB — URINE CULTURE

## 2016-11-27 LAB — CBC
HCT: 28.3 % — ABNORMAL LOW (ref 36.0–46.0)
Hemoglobin: 8.5 g/dL — ABNORMAL LOW (ref 12.0–15.0)
MCH: 32 pg (ref 26.0–34.0)
MCHC: 30 g/dL (ref 30.0–36.0)
MCV: 106.4 fL — ABNORMAL HIGH (ref 78.0–100.0)
Platelets: 205 K/uL (ref 150–400)
RBC: 2.66 MIL/uL — ABNORMAL LOW (ref 3.87–5.11)
RDW: 16.9 % — ABNORMAL HIGH (ref 11.5–15.5)
WBC: 8.7 K/uL (ref 4.0–10.5)

## 2016-11-27 LAB — COMPREHENSIVE METABOLIC PANEL WITH GFR
ALT: 40 U/L (ref 14–54)
AST: 40 U/L (ref 15–41)
Albumin: 1.9 g/dL — ABNORMAL LOW (ref 3.5–5.0)
Alkaline Phosphatase: 313 U/L — ABNORMAL HIGH (ref 38–126)
Anion gap: 8 (ref 5–15)
BUN: 109 mg/dL — ABNORMAL HIGH (ref 6–20)
CO2: 18 mmol/L — ABNORMAL LOW (ref 22–32)
Calcium: 8.7 mg/dL — ABNORMAL LOW (ref 8.9–10.3)
Chloride: 129 mmol/L — ABNORMAL HIGH (ref 101–111)
Creatinine, Ser: 2.05 mg/dL — ABNORMAL HIGH (ref 0.44–1.00)
GFR calc Af Amer: 24 mL/min — ABNORMAL LOW
GFR calc non Af Amer: 21 mL/min — ABNORMAL LOW
Glucose, Bld: 164 mg/dL — ABNORMAL HIGH (ref 65–99)
Potassium: 3.5 mmol/L (ref 3.5–5.1)
Sodium: 155 mmol/L — ABNORMAL HIGH (ref 135–145)
Total Bilirubin: 0.3 mg/dL (ref 0.3–1.2)
Total Protein: 6.3 g/dL — ABNORMAL LOW (ref 6.5–8.1)

## 2016-11-27 LAB — LACTIC ACID, PLASMA
LACTIC ACID, VENOUS: 2.5 mmol/L — AB (ref 0.5–1.9)
LACTIC ACID, VENOUS: 4.4 mmol/L — AB (ref 0.5–1.9)
Lactic Acid, Venous: 1 mmol/L (ref 0.5–1.9)
Lactic Acid, Venous: 3.7 mmol/L (ref 0.5–1.9)

## 2016-11-27 LAB — TROPONIN I: Troponin I: 0.3 ng/mL

## 2016-11-27 LAB — BETA-HYDROXYBUTYRIC ACID: Beta-Hydroxybutyric Acid: 0.12 mmol/L (ref 0.05–0.27)

## 2016-11-27 LAB — HEMOGLOBIN A1C
Hgb A1c MFr Bld: 7.9 % — ABNORMAL HIGH (ref 4.8–5.6)
Mean Plasma Glucose: 180 mg/dL

## 2016-11-27 MED ORDER — INSULIN ASPART 100 UNIT/ML ~~LOC~~ SOLN
0.0000 [IU] | SUBCUTANEOUS | Status: DC
  Administered 2016-11-27: 20 [IU] via SUBCUTANEOUS
  Administered 2016-11-27: 2 [IU] via SUBCUTANEOUS
  Administered 2016-11-27: 8 [IU] via SUBCUTANEOUS
  Administered 2016-11-27: 16 [IU] via SUBCUTANEOUS
  Administered 2016-11-28 (×5): 4 [IU] via SUBCUTANEOUS
  Administered 2016-11-28: 8 [IU] via SUBCUTANEOUS
  Administered 2016-11-29: 2 [IU] via SUBCUTANEOUS
  Administered 2016-11-29: 4 [IU] via SUBCUTANEOUS
  Administered 2016-11-29: 8 [IU] via SUBCUTANEOUS
  Administered 2016-11-29: 2 [IU] via SUBCUTANEOUS
  Administered 2016-11-29: 4 [IU] via SUBCUTANEOUS
  Administered 2016-11-30: 2 [IU] via SUBCUTANEOUS
  Administered 2016-11-30: 4 [IU] via SUBCUTANEOUS
  Administered 2016-11-30 (×2): 2 [IU] via SUBCUTANEOUS
  Administered 2016-11-30: 4 [IU] via SUBCUTANEOUS
  Administered 2016-12-01: 2 [IU] via SUBCUTANEOUS
  Administered 2016-12-01 (×2): 4 [IU] via SUBCUTANEOUS
  Administered 2016-12-01 – 2016-12-04 (×8): 2 [IU] via SUBCUTANEOUS
  Administered 2016-12-04: 4 [IU] via SUBCUTANEOUS

## 2016-11-27 MED ORDER — SODIUM CHLORIDE 0.45 % IV SOLN
INTRAVENOUS | Status: DC
  Administered 2016-11-27 – 2016-11-28 (×3): via INTRAVENOUS

## 2016-11-27 MED ORDER — SODIUM CHLORIDE 0.45 % IV SOLN
INTRAVENOUS | Status: DC
  Administered 2016-11-27 (×2): via INTRAVENOUS
  Filled 2016-11-27 (×4): qty 1000

## 2016-11-27 MED ORDER — INSULIN ASPART 100 UNIT/ML ~~LOC~~ SOLN
4.0000 [IU] | Freq: Three times a day (TID) | SUBCUTANEOUS | Status: DC
  Administered 2016-11-27 – 2016-12-04 (×18): 4 [IU] via SUBCUTANEOUS

## 2016-11-27 MED ORDER — JEVITY 1.2 CAL PO LIQD: 1000.0000 mL | ORAL | Status: DC

## 2016-11-27 MED ORDER — POTASSIUM CHLORIDE CRYS ER 20 MEQ PO TBCR
40.0000 meq | EXTENDED_RELEASE_TABLET | Freq: Once | ORAL | Status: AC
  Administered 2016-11-27: 40 meq via ORAL
  Filled 2016-11-27: qty 2

## 2016-11-27 MED ORDER — PENICILLIN G POTASSIUM 5000000 UNITS IJ SOLR
2.0000 10*6.[IU] | INTRAMUSCULAR | Status: DC
Start: 2016-11-27 — End: 2016-12-03
  Administered 2016-11-27 – 2016-12-03 (×36): 2 10*6.[IU] via INTRAVENOUS
  Filled 2016-11-27 (×41): qty 2

## 2016-11-27 MED ORDER — INSULIN ASPART 100 UNIT/ML ~~LOC~~ SOLN: 0.0000 [IU] | SUBCUTANEOUS | Status: DC

## 2016-11-27 MED ORDER — INSULIN DETEMIR 100 UNIT/ML ~~LOC~~ SOLN
10.0000 [IU] | Freq: Every day | SUBCUTANEOUS | Status: DC
  Administered 2016-11-27 – 2016-12-03 (×7): 10 [IU] via SUBCUTANEOUS
  Filled 2016-11-27 (×8): qty 0.1

## 2016-11-27 NOTE — Progress Notes (Signed)
Pt placed on trach collar @ 30% O2.  Pt stable at this time.  Will continue to monitor.

## 2016-11-27 NOTE — Consult Note (Signed)
PULMONARY / CRITICAL CARE MEDICINE   Name: Evelyn [name] MRN: [id number] DOB: 25-Dec-1929    ADMISSION DATE:  11/26/2016 CONSULTATION DATE:  1/29  REFERRING MD:  Ronalee Belts   CHIEF COMPLAINT:  Acute on chronic respiratory failure   HISTORY OF PRESENT ILLNESS:   This is a 83 year old trach dependent female in setting of CVA, She is G tube dependent. Per medical record review was admitted 1/28 w/ 24 hr h/o decreased LOC (at baseline non-verbal). In route became apneic & required BMV. Was admitted w/ working dx of acute encephalopathy in setting of sepsis due to  Group B strep bacteremia &  possible UTI along w/ acute on chronic renal failure, NAGMA and volume depletion. PCCM was asked to assess as pt was placed on mechanical ventilation in ER.   PAST MEDICAL HISTORY :  She  has a past medical history of Arthritis; Cognitive communication deficit; COPD (chronic obstructive pulmonary disease) (HCC); Dysphagia; Encephalopathy; Flaccid hemiplegia (HCC); Hyperlipidemia; Hypertension; Iron deficiency anemia; Stroke [facility]); and Thyroid disease.  PAST SURGICAL HISTORY: She  has a past surgical history that includes Tracheostomy tube placement (N/A, 08/11/2016) and ir generic historical (08/16/2016).  Allergies  Allergen Reactions  . Erythromycin Itching    No current facility-administered medications on file prior to encounter.    Current Outpatient Prescriptions on File Prior to Encounter  Medication Sig  . amantadine (SYMMETREL) 100 MG capsule Take 100 mg by mouth daily.  Marland Kitchen amLODipine (NORVASC) 10 MG tablet Take 10 mg by mouth daily.  Marland Kitchen aspirin 81 MG chewable tablet Chew 81 mg by mouth daily.  Marland Kitchen atorvastatin (LIPITOR) 40 MG tablet Take 40 mg by mouth daily.  Marland Kitchen docusate (COLACE) 50 MG/5ML liquid Take 100 mg by mouth 2 (two) times daily.  . famotidine (PEPCID) 20 MG tablet Take 20 mg by mouth daily.  . ferrous sulfate 300 (60 Fe) MG/5ML syrup Take 300 mg by mouth 2 (two) times daily with a  meal.  . guaifenesin (ROBITUSSIN) 100 MG/5ML syrup Take 200 mg by mouth every 8 (eight) hours as needed for congestion.  . insulin aspart (NOVOLOG) 100 UNIT/ML injection Inject 10 Units into the skin once.  Marland Kitchen ipratropium-albuterol (DUONEB) 0.5-2.5 (3) MG/3ML SOLN Take 3 mLs by nebulization every 8 (eight) hours as needed (wheezing).  Marland Kitchen levothyroxine (SYNTHROID, LEVOTHROID) 25 MCG tablet Take 25 mcg by mouth daily before breakfast.  . Multiple Vitamin (MULTIVITAMIN WITH MINERALS) TABS tablet Take 1 tablet by mouth daily.  . sertraline (ZOLOFT) 100 MG tablet Take 100 mg by mouth at bedtime.  . Water For Irrigation, Sterile (FREE WATER) SOLN Place 200 mLs into feeding tube every 4 (four) hours.  . fentaNYL (DURAGESIC - DOSED MCG/HR) 12 MCG/HR Place 12.5 mcg onto the skin every 3 (three) days.    FAMILY HISTORY:  Her has no family status information on file.    SOCIAL HISTORY: She  lives in SNF  REVIEW OF SYSTEMS:   Not able   SUBJECTIVE:  Tolerated PS 0 and peep 0   VITAL SIGNS: BP (!) 103/59   Pulse 89   Temp 99.5 F (37.5 C) (Axillary)   Resp (!) 24   Ht 5' (1.524 m)   Wt 91 lb (41.3 kg)   SpO2 100%   BMI 17.77 kg/m   HEMODYNAMICS:    VENTILATOR SETTINGS: Vent Mode: PRVC FiO2 (%):  [30 %] 30 % Set Rate:  [14 bmp] 14 bmp Vt Set:  [370 mL] 370 mL PEEP:  [5  cmH20-8 cmH20] 8 cmH20 Plateau Pressure:  [11 cmH20-14 cmH20] 11 cmH20  INTAKE / OUTPUT: I/O last 3 completed shifts: In: 1929 [I.V.:1068.5; NG/GT:860.5] Out: -   PHYSICAL EXAMINATION: General:  Chronically ill cachectic female. Contracted and unresponsive in bed Neuro:  unresponsive and contracted  HEENT:  #4 cuffed trach MM dry no JVD Cardiovascular:  RRR no MRG Lungs:  Clear w/ equal chest rise  Abdomen:  Soft, PEG in place  Musculoskeletal:  Contracted  Skin:  Warm and dry   LABS:  BMET  Recent Labs Lab 11/27/16 0210 11/27/16 0222 11/27/16 0750  NA 154* 155* 150*  K 3.5 3.5 5.3*  CL 126*  129* 125*  CO2 16* 18* 18*  BUN 112* 109* 109*  CREATININE 2.09* 2.05* 2.06*  GLUCOSE 165* 164* 401*    Electrolytes  Recent Labs Lab 11/27/16 0210 11/27/16 0222 11/27/16 0750  CALCIUM 8.9 8.7* 8.4*    CBC  Recent Labs Lab 11/26/16 0835 11/26/16 0847 11/26/16 1457 11/27/16 0222  WBC 20.4*  --  21.6* 8.7  HGB 9.3* 9.5* 8.6* 8.5*  HCT 31.5* 28.0* 28.4* 28.3*  PLT 300  --  279 205    Coag's No results for input(s): APTT, INR in the last 168 hours.  Sepsis Markers  Recent Labs Lab 11/27/16 0210 11/27/16 0750 11/27/16 1038  LATICACIDVEN 2.5* 1.0 3.7*    ABG  Recent Labs Lab 11/26/16 0545  PHART 7.445  PCO2ART 29.5*  PO2ART 98.0    Liver Enzymes  Recent Labs Lab 11/26/16 0835 11/27/16 0222  AST 50* 40  ALT 48 40  ALKPHOS 279* 313*  BILITOT 0.7 0.3  ALBUMIN 2.2* 1.9*    Cardiac Enzymes  Recent Labs Lab 11/26/16 1457 11/26/16 2002 11/27/16 0210  TROPONINI 0.37* 0.33* 0.30*    Glucose  Recent Labs Lab 11/26/16 2343 11/27/16 0049 11/27/16 0215 11/27/16 0456 11/27/16 0754 11/27/16 1105  GLUCAP 143* 166* 156* 280* 335* 371*    Imaging No results found.   STUDIES:    CULTURES: Respiratory culture 1/28: rare gpc clusters and GPR>> UC 1/28 <10 K  BCX2 1/28: group B strep  ANTIBIOTICS: vanc 1/27>>> Cefepime 1/27>>>  SIGNIFICANT EVENTS:   LINES/TUBES:   DISCUSSION: Sepsis in setting of group B strep bacteremia, further c/b acute on chronic renal failure, volume depletion, severe hypernatremia, and NAGMA and AGMA. Her CXR is clear. Suspect apnea leading to respiratory failure was result of her multiple metabolic derangements. She appears as though she can sustain spontaneous ventilation at this point w/out the vent. Will transition to ATC but keep vent at bedside. She still needs aggressive management of her water balance, and glycemic control. She also needs palliative care consult   ASSESSMENT /  PLAN:  PULMONARY A: Acute on chronic respiratory failure in setting of sepsis and altered mental status  Tracheostomy status  Suspect apnea was result of severe metabolic derrangements.  P:   Begin ATC as tolerated Keep vent at bedside for next 24hrs  CARDIOVASCULAR A:  Sepsis  HTN P:  Cont IV hydration  Cont tele   RENAL A:   Acute on chronic renal failure  NAGMA & mild lactic acidosis -->not currently in shock Hyperchloremia  Hypernatremia  Hyperkalemia   P:   Cont IV hydration  Strict I&O Replace water Renal dose meds  Dc further lactic acids  GASTROINTESTINAL A:   Dysphagia  Severe protein calorie malnutrition  PEG tube P:   Cont tubefeeds  HEMATOLOGIC A:   Anemia of chronic  disease.  P:  Trend CBC Cont PPI  INFECTIOUS A:   Sepsis in setting of Group B strep bacteremia  UTI  P:   ID consulted. Will defer to them   ENDOCRINE A:   DM w/ severe hyperglycemia  P:   Trend cbg Ck  NEUROLOGIC A:   CVA Dementia Contracted  P:   RASS goal: 0  Hold sedating meds    Simonne MartinetPeter E Babcock ACNP-BC [facility] Pulmonary/Critical Care Pager # [phone] OR # [phone] if no answer  11/27/2016, 2:15 PM  Attending Note:  83 year old female with chronic trach who presented septic and with AMS and was placed on the vent for episode of apnea.  Patient has been on the vent since admission.  On exam, patient is encephalopathic and responds only to pain, unsure what the baseline is.  I reviewed CXR myself, trach is in good position.  Discussed with PCCM-NP and bedside RN.  Acute on chronic respiratory failure:  - Place on TC  - If able to maintain on TC then maintain TC 24/7  Tracheostomy status:  - Maintain trach as cuffed pending goals of care discussion below.  Dysphagia:  - NPO  - Maintain PEG  Dehydration:  - IV hydration as ordered.  Goals of care:  - Recommend palliative care consultation for goals of care discussion.  PCCM will sign off,  please call back if needed.  Patient seen and examined, agree with above note.  I dictated the care and orders written for this patient under my direction.  Alyson ReedyWesam G Yacoub, MD [phone]

## 2016-11-27 NOTE — Progress Notes (Signed)
No charge note:   Palliative consult received.   Family meeting scheduled for tomorrow 1/30 at 9am with patient's son, Colman CaterDonald Scott.   Ocie BobKasie Mahan, AGNP-C Palliative Medicine  Please call Palliative Medicine team phone with any questions [phone]. For individual providers please see AMION.

## 2016-11-27 NOTE — Consult Note (Signed)
[place] for Infectious Disease       Reason for Consult: GBS bacteremia    Referring Physician: Dr. Carolin Sicks  Principal Problem:   Acute metabolic encephalopathy Active Problems:   AKI (acute kidney injury) ([place])   Hypertension   Chronic obstructive pulmonary disease (HCC)   Hyperlipidemia   Anemia   Hypernatremia   Gastrostomy tube dependent (HCC)   Hyperglycemia   DM (diabetes mellitus) type II controlled peripheral vascular disorder (HCC)   Acute on chronic respiratory failure (HCC)   Malnutrition of moderate degree   Sepsis due to Streptococcus agalactiae ([place])   . sodium chloride  10 mL/hr Intravenous Once  . amantadine  100 mg Oral Daily  . amLODipine  10 mg Oral Daily  . aspirin  81 mg Oral Daily  . atorvastatin  40 mg Oral Daily  . chlorhexidine gluconate (MEDLINE KIT)  15 mL Mouth Rinse BID  . Chlorhexidine Gluconate Cloth  6 each Topical Q0600  . docusate  100 mg Oral BID  . famotidine  20 mg Oral Daily  . fentaNYL  12.5 mcg Transdermal Q72H  . ferrous sulfate  300 mg Oral BID WC  . free water  200 mL Per Tube Q4H  . heparin  5,000 Units Subcutaneous Q8H  . insulin aspart  0-24 Units Subcutaneous Q4H  . insulin aspart  10 Units Subcutaneous Once  . insulin aspart  4 Units Subcutaneous TID WC  . insulin detemir  10 Units Subcutaneous QHS  . levothyroxine  25 mcg Oral QAC breakfast  . mouth rinse  15 mL Mouth Rinse QID  . mupirocin ointment  1 application Nasal BID  . pencillin G potassium IV  2 Million Units Intravenous Q4H  . sertraline  100 mg Oral QHS    Recommendations: Narrow to penicillin  Assessment: She has GBS bacteremia without an obvious source.  I doubt UTI with minimal WBCs in the setting of glucosuria.  If aggressive measures desired, would need a TEE, unless IE noted on TTE.   Would emphasize palliative care, minimal interventions.  Palliative care to see, though has seen family before.   Antibiotics: vanomcyin and  cefepime  HPI: Evelyn [name] is a 83 y.o. female with chronic respiratory failure with hypoxia, tracheostomy, gastrostomy following CVA in September 2017 at [facility] then to [facility] and underwent left ICA thrombectomy and tPA.  She was then transferred to [facility] and also admitted in December for pneumonia.  She apparently has remained minimally responsive, non-verbal but family has wanted continued aggressive care.  She currently is non verbal and not responsive at all.  She came in apparently with being less responsive and more SOB.  She had leukocytosis, elevated lactate and now blood cultures with GBS bacteremia in 2/2.    Review of Systems: Unable to be assessed due to mental status   Past Medical History:  Diagnosis Date  . Arthritis   . Cognitive communication deficit   . COPD (chronic obstructive pulmonary disease) ([place])   . Dysphagia   . Encephalopathy   . Flaccid hemiplegia ([place])   . Hyperlipidemia   . Hypertension   . Iron deficiency anemia   . Stroke ([place])   . Thyroid disease     Social History  Substance Use Topics  . Smoking status: Unknown If Ever Smoked  . Smokeless tobacco: Not on file  . Alcohol use Not on file  unobtainable  [place]: unable to obtain  Allergies  Allergen  Reactions  . Erythromycin Itching    Physical Exam: Constitutional: cachectic  Vitals:   11/27/16 1100 11/27/16 1124  BP:  (!) 103/59  Pulse:  89  Resp:  (!) 24  Temp: 99.5 F (37.5 C)    EYES: anicteric ENMT: no thrush Cardiovascular: Cor RRR Respiratory: CTA B; normal respiratory effort GI: Bowel sounds are normal, liver is not enlarged, spleen is not enlarged Musculoskeletal: no pedal edema noted Skin: negatives: no rash Hematologic: no cervical lad  Lab Results  Component Value Date   WBC 8.7 11/27/2016   HGB 8.5 (L) 11/27/2016   HCT 28.3 (L) 11/27/2016   MCV 106.4 (H) 11/27/2016   PLT 205 11/27/2016    Lab Results  Component Value Date    CREATININE 2.06 (H) 11/27/2016   BUN 109 (H) 11/27/2016   NA 150 (H) 11/27/2016   K 5.3 (H) 11/27/2016   CL 125 (H) 11/27/2016   CO2 18 (L) 11/27/2016    Lab Results  Component Value Date   ALT 40 11/27/2016   AST 40 11/27/2016   ALKPHOS 313 (H) 11/27/2016     Microbiology: Recent Results (from the past 240 hour(s))  Blood Culture (routine x 2)     Status: None (Preliminary result)   Collection Time: 11/26/16  5:44 AM  Result Value Ref Range Status   Specimen Description BLOOD LEFT HAND  Final   Special Requests IN PEDIATRIC BOTTLE 1CC  Final   Culture  Setup Time   Final    GRAM POSITIVE COCCI IN CLUSTERS IN PEDIATRIC BOTTLE CRITICAL RESULT CALLED TO, READ BACK BY AND VERIFIED WITH: RUTH CLARK,PHARMD AT 5361 11/27/16 [id number].[email or web address]    Culture   Final    GRAM POSITIVE COCCI CULTURE REINCUBATED FOR BETTER GROWTH    Report Status PENDING  Incomplete  Blood Culture (routine x 2)     Status: Abnormal (Preliminary result)   Collection Time: 11/26/16  6:00 AM  Result Value Ref Range Status   Specimen Description BLOOD RIGHT FOREARM  Final   Special Requests BOTTLES DRAWN AEROBIC AND ANAEROBIC 5CC  Final   Culture  Setup Time   Final    GRAM POSITIVE COCCI IN CHAINS ANAEROBIC BOTTLE ONLY CRITICAL RESULT CALLED TO, READ BACK BY AND VERIFIED WITH: A JOHNSTON,PHARMD AT 2058 11/26/16 BY J FUDESCO    Culture (A)  Final    GROUP B STREP(S.AGALACTIAE)ISOLATED CULTURE REINCUBATED FOR BETTER GROWTH    Report Status PENDING  Incomplete  Blood Culture ID Panel (Reflexed)     Status: Abnormal   Collection Time: 11/26/16  6:00 AM  Result Value Ref Range Status   Enterococcus species NOT DETECTED NOT DETECTED Final   Listeria monocytogenes NOT DETECTED NOT DETECTED Final   Staphylococcus species NOT DETECTED NOT DETECTED Final   Staphylococcus aureus NOT DETECTED NOT DETECTED Final   Streptococcus species DETECTED (A) NOT DETECTED Final    Comment: CRITICAL RESULT CALLED TO, READ BACK BY AND  VERIFIED WITH: A JOHNSTON, PHARM, 11/26/16 AT 2058 BY J FUDESCO    Streptococcus agalactiae DETECTED (A) NOT DETECTED Final    Comment: CRITICAL RESULT CALLED TO, READ BACK BY AND VERIFIED WITH: A JOHNSTON, PHARM, 11/26/16 AT 2058 BY J FUDESCO    Streptococcus pneumoniae NOT DETECTED NOT DETECTED Final   Streptococcus pyogenes NOT DETECTED NOT DETECTED Final   Acinetobacter baumannii NOT DETECTED NOT DETECTED Final   Enterobacteriaceae species NOT DETECTED NOT DETECTED Final   Enterobacter cloacae complex NOT DETECTED NOT DETECTED Final  Escherichia coli NOT DETECTED NOT DETECTED Final   Klebsiella oxytoca NOT DETECTED NOT DETECTED Final   Klebsiella pneumoniae NOT DETECTED NOT DETECTED Final   Proteus species NOT DETECTED NOT DETECTED Final   Serratia marcescens NOT DETECTED NOT DETECTED Final   Haemophilus influenzae NOT DETECTED NOT DETECTED Final   Neisseria meningitidis NOT DETECTED NOT DETECTED Final   Pseudomonas aeruginosa NOT DETECTED NOT DETECTED Final   Candida albicans NOT DETECTED NOT DETECTED Final   Candida glabrata NOT DETECTED NOT DETECTED Final   Candida krusei NOT DETECTED NOT DETECTED Final   Candida parapsilosis NOT DETECTED NOT DETECTED Final   Candida tropicalis NOT DETECTED NOT DETECTED Final  Culture, respiratory     Status: None (Preliminary result)   Collection Time: 11/26/16  9:24 AM  Result Value Ref Range Status   Specimen Description TRACHEAL ASPIRATE  Final   Special Requests NONE  Final   Gram Stain   Final    RARE WBC PRESENT, PREDOMINANTLY PMN RARE GRAM POSITIVE COCCI IN CLUSTERS RARE GRAM POSITIVE RODS    Culture ABUNDANT UNIDENTIFIED ORGANISM  Final   Report Status PENDING  Incomplete  Urine culture     Status: Abnormal   Collection Time: 11/26/16 10:08 AM  Result Value Ref Range Status   Specimen Description URINE, RANDOM  Final   Special Requests NONE  Final   Culture <10,000 COLONIES/mL INSIGNIFICANT GROWTH (A)  Final   Report Status  11/27/2016 FINAL  Final  MRSA PCR Screening     Status: Abnormal   Collection Time: 11/26/16  7:33 PM  Result Value Ref Range Status   MRSA by PCR POSITIVE (A) NEGATIVE Final    Comment:        The GeneXpert MRSA Assay (FDA approved for NASAL specimens only), is one component of a comprehensive MRSA colonization surveillance program. It is not intended to diagnose MRSA infection nor to guide or monitor treatment for MRSA infections. RESULT CALLED TO, READ BACK BY AND VERIFIED WITH: Candise Che RN [id number] AT [address], [place] for Infectious Disease [facility] [email or web address] [phone] pager  [phone] cell 11/27/2016, 2:56 PM

## 2016-11-27 NOTE — Progress Notes (Signed)
CRITICAL VALUE ALERT  Critical value received: Lactic acid 3.7  Date of notification: 11/27/16  Time of notification: 1140  Critical value read back:Yes.    Nurse who received alert:  Rachael Darbyroyce Jermany Rimel RN   MD notified (1st page): 1140  Time of first page:  1140  MD notified (2nd page):  Time of second page:  Responding MD:  Dr Ronalee BeltsBhandari  Time MD responded: 1150

## 2016-11-27 NOTE — Progress Notes (Signed)
PROGRESS NOTE    Evelyn [name]  YPP:[id number] DOB: [birth year] DOA: 11/26/2016 PCP: Pcp Not In System   Brief Narrative: 83 y.o. female with medical history significant of hypertension, CVA, COPD with chronic respiratory failure with hypoxia, status post try tracheostomy, status post gastrostomy, anemia, previous history of acute kidney injury and hypernatremia during the last hospitalization in December 2017 who presented to them emergency department with altered mental status and respiratory distress. She is  A resident of the SNF and at baseline is not verbal, but during the last 24 hours she became less responsive and her her breathing became more labored.  Assessment & Plan:   # Acute metabolic encephalopathy: Likely in the setting of UTI and bacteremia. CT scan of head with chronic stroke. No acute changes. -Patient is currently on trach, has PEG tube -Patient is nonverbal at baseline. -Continue to monitor. -I consulted palliative care service.  # Sepsis due to streptococcus bacteremia: Patient with elevated lactate level, leukocytosis on admission. Currently on cefepime and vancomycin. Follow up final culture results. Follow-up urine culture. Continue to monitor. -I consulted infectious disease and discussed with Dr. Jimmye Norman. -monitor fever, leukocytosis  #Acute on chronic respiratory failure, hypoxemic: Currently on ventilator. I consulted pulmonologist and discussed with the critical care team. Chest x-ray with no active disease. Continue to monitor. Patient has chronic trach.  # Hypernatremia - recurrent problem, -Serum sodium level is improving. Continue half NS and free water through the tube. Continue to monitor electrolytes.  # Uncontrolled DM type II with Hyperglycemia:  HgbA1C 7.9 -Insulin dose adjusted. Started Levemir 10 units, 3 female and continue sliding scale. Monitor blood sugar level.  # Acute on chronic kidney disease: Likely prerenal and UTI -Serum  creatinine level is stable. Continue IV fluid. Monitor BMP. Serum creatinine level 2.06 today.              # HTN: Monitor blood pressure. Continue amlodipine -Monitor blood pressure.  # Hypothyroidism Continue synthroid  # Elevated troponin likely in the setting of sepsis and renal failure. I will check echocardiogram.  #Goals of care: Patient with chronic comorbidities, nonverbal, has trach and PEG. I believe she'll benefit from palliative care consult.  Principal Problem:   Acute metabolic encephalopathy Active Problems:   AKI (acute kidney injury) ([place])   Hypertension   Chronic obstructive pulmonary disease (HCC)   Hyperlipidemia   Anemia   Hypernatremia   Gastrostomy tube dependent (HCC)   Hyperglycemia   DM (diabetes mellitus) type II controlled peripheral vascular disorder (HCC)   Acute on chronic respiratory failure (HCC)   Malnutrition of moderate degree  DVT prophylaxis: Heparin subcutaneous. Code Status: Full code Family Communication: No family present at bedside Disposition Plan: Likely discharge to nursing home in a few days. Depending on clinical improvement.    Consultants:   Consulted palliative care and critical care today.  Procedures: Mechanical ventilator Antimicrobials: Vancomycin and cefepime since 1/28  Subjective: Patient was seen and examined at bedside. Patient was nonverbal and not responsive. On trach with ventilator. Unable to obtain review of system.  Objective: Vitals:   11/27/16 1000 11/27/16 1100 11/27/16 1124 11/27/16 1138  BP: (!) 106/59  (!) 103/59   Pulse: 89  89   Resp: 20  (!) 24   Temp:  99.5 F (37.5 C)    TempSrc:  Axillary    SpO2: 100%  100%   Weight:    41.3 kg (91 lb)  Height:    5' (1.524 m)  Intake/Output Summary (Last 24 hours) at 11/27/16 1151 Last data filed at 11/27/16 0858  Gross per 24 hour  Intake          1979.03 ml  Output                0 ml  Net          1979.03 ml   Filed Weights    11/26/16 0528 11/27/16 0357 11/27/16 1138  Weight: 44.7 kg (98 lb 8.7 oz) 41.5 kg (91 lb 7.9 oz) 41.3 kg (91 lb)    Examination:  General exam: Ill-looking female somnolent, lethargic, not responsive and nonverbal. Tracheostomy site clean with ventilator.  Respiratory system: Coarse breathing sounds bilateral, no wheezing or crackle. Cardiovascular system: S1 & S2 heard, RRR.  No pedal edema. Gastrointestinal system: Abdomen is nondistended, soft. Normal bowel sounds heard. PEG site clean. Central nervous system: Lethargic Extremities: No edema Skin: No rashes, lesions or ulcers Psychiatry: Lethargic, not responsive.    Data Reviewed: I have personally reviewed following labs and imaging studies  CBC:  Recent Labs Lab 11/26/16 0639 11/26/16 0835 11/26/16 0847 11/26/16 1457 11/27/16 0222  WBC 9.1 20.4*  --  21.6* 8.7  NEUTROABS 7.8* 18.2*  --   --   --   HGB 5.3* 9.3* 9.5* 8.6* 8.5*  HCT 17.5* 31.5* 28.0* 28.4* 28.3*  MCV 109.4* 108.6*  --  106.0* 106.4*  PLT 140* 300  --  279 856   Basic Metabolic Panel:  Recent Labs Lab 11/26/16 1457 11/26/16 2002 11/27/16 0210 11/27/16 0222 11/27/16 0750  NA 156* 159* 154* 155* 150*  K 2.8* 3.2* 3.5 3.5 5.3*  CL 129* 128* 126* 129* 125*  CO2 17* 20* 16* 18* 18*  GLUCOSE 401* 217* 165* 164* 401*  BUN 128* 119* 112* 109* 109*  CREATININE 2.58*  2.53* 2.28* 2.09* 2.05* 2.06*  CALCIUM 8.8* 9.2 8.9 8.7* 8.4*   GFR: Estimated Creatinine Clearance: 13.3 mL/min (by C-G formula based on SCr of 2.06 mg/dL (H)). Liver Function Tests:  Recent Labs Lab 11/26/16 0835 11/27/16 0222  AST 50* 40  ALT 48 40  ALKPHOS 279* 313*  BILITOT 0.7 0.3  PROT 7.4 6.3*  ALBUMIN 2.2* 1.9*   No results for input(s): LIPASE, AMYLASE in the last 168 hours. No results for input(s): AMMONIA in the last 168 hours. Coagulation Profile: No results for input(s): INR, PROTIME in the last 168 hours. Cardiac Enzymes:  Recent Labs Lab  11/26/16 0835 11/26/16 1457 11/26/16 2002 11/27/16 0210  TROPONINI 0.52* 0.37* 0.33* 0.30*   BNP (last 3 results) No results for input(s): PROBNP in the last 8760 hours. HbA1C:  Recent Labs  11/26/16 1457  HGBA1C 7.9*   CBG:  Recent Labs Lab 11/27/16 0049 11/27/16 0215 11/27/16 0456 11/27/16 0754 11/27/16 1105  GLUCAP 166* 156* 280* 335* 371*   Lipid Profile: No results for input(s): CHOL, HDL, LDLCALC, TRIG, CHOLHDL, LDLDIRECT in the last 72 hours. Thyroid Function Tests: No results for input(s): TSH, T4TOTAL, FREET4, T3FREE, THYROIDAB in the last 72 hours. Anemia Panel: No results for input(s): VITAMINB12, FOLATE, FERRITIN, TIBC, IRON, RETICCTPCT in the last 72 hours. Sepsis Labs:  Recent Labs Lab 11/26/16 2358 11/27/16 0210 11/27/16 0750 11/27/16 1038  LATICACIDVEN 4.4* 2.5* 1.0 3.7*    Recent Results (from the past 240 hour(s))  Blood Culture (routine x 2)     Status: None (Preliminary result)   Collection Time: 11/26/16  5:44 AM  Result Value Ref Range Status  Specimen Description BLOOD LEFT HAND  Final   Special Requests IN PEDIATRIC BOTTLE 1CC  Final   Culture  Setup Time   Final    GRAM POSITIVE COCCI IN CLUSTERS IN PEDIATRIC BOTTLE CRITICAL RESULT CALLED TO, READ BACK BY AND VERIFIED WITH: RUTH CLARK,PHARMD AT 0923 11/27/16 G.MCADOO    Culture   Final    GRAM POSITIVE COCCI CULTURE REINCUBATED FOR BETTER GROWTH    Report Status PENDING  Incomplete  Blood Culture (routine x 2)     Status: Abnormal (Preliminary result)   Collection Time: 11/26/16  6:00 AM  Result Value Ref Range Status   Specimen Description BLOOD RIGHT FOREARM  Final   Special Requests BOTTLES DRAWN AEROBIC AND ANAEROBIC 5CC  Final   Culture  Setup Time   Final    GRAM POSITIVE COCCI IN CHAINS ANAEROBIC BOTTLE ONLY CRITICAL RESULT CALLED TO, READ BACK BY AND VERIFIED WITH: A JOHNSTON,PHARMD AT 2058 11/26/16 BY J FUDESCO    Culture (A)  Final    GROUP B  STREP(S.AGALACTIAE)ISOLATED CULTURE REINCUBATED FOR BETTER GROWTH    Report Status PENDING  Incomplete  Blood Culture ID Panel (Reflexed)     Status: Abnormal   Collection Time: 11/26/16  6:00 AM  Result Value Ref Range Status   Enterococcus species NOT DETECTED NOT DETECTED Final   Listeria monocytogenes NOT DETECTED NOT DETECTED Final   Staphylococcus species NOT DETECTED NOT DETECTED Final   Staphylococcus aureus NOT DETECTED NOT DETECTED Final   Streptococcus species DETECTED (A) NOT DETECTED Final    Comment: CRITICAL RESULT CALLED TO, READ BACK BY AND VERIFIED WITH: A JOHNSTON, PHARM, 11/26/16 AT 2058 BY J FUDESCO    Streptococcus agalactiae DETECTED (A) NOT DETECTED Final    Comment: CRITICAL RESULT CALLED TO, READ BACK BY AND VERIFIED WITH: A JOHNSTON, PHARM, 11/26/16 AT 2058 BY J FUDESCO    Streptococcus pneumoniae NOT DETECTED NOT DETECTED Final   Streptococcus pyogenes NOT DETECTED NOT DETECTED Final   Acinetobacter baumannii NOT DETECTED NOT DETECTED Final   Enterobacteriaceae species NOT DETECTED NOT DETECTED Final   Enterobacter cloacae complex NOT DETECTED NOT DETECTED Final   Escherichia coli NOT DETECTED NOT DETECTED Final   Klebsiella oxytoca NOT DETECTED NOT DETECTED Final   Klebsiella pneumoniae NOT DETECTED NOT DETECTED Final   Proteus species NOT DETECTED NOT DETECTED Final   Serratia marcescens NOT DETECTED NOT DETECTED Final   Haemophilus influenzae NOT DETECTED NOT DETECTED Final   Neisseria meningitidis NOT DETECTED NOT DETECTED Final   Pseudomonas aeruginosa NOT DETECTED NOT DETECTED Final   Candida albicans NOT DETECTED NOT DETECTED Final   Candida glabrata NOT DETECTED NOT DETECTED Final   Candida krusei NOT DETECTED NOT DETECTED Final   Candida parapsilosis NOT DETECTED NOT DETECTED Final   Candida tropicalis NOT DETECTED NOT DETECTED Final  Culture, respiratory     Status: None (Preliminary result)   Collection Time: 11/26/16  9:24 AM  Result Value  Ref Range Status   Specimen Description TRACHEAL ASPIRATE  Final   Special Requests NONE  Final   Gram Stain   Final    RARE WBC PRESENT, PREDOMINANTLY PMN RARE GRAM POSITIVE COCCI IN CLUSTERS RARE GRAM POSITIVE RODS    Culture PENDING  Incomplete   Report Status PENDING  Incomplete  Urine culture     Status: Abnormal   Collection Time: 11/26/16 10:08 AM  Result Value Ref Range Status   Specimen Description URINE, RANDOM  Final   Special Requests NONE  Final   Culture <10,000 COLONIES/mL INSIGNIFICANT GROWTH (A)  Final   Report Status 11/27/2016 FINAL  Final  MRSA PCR Screening     Status: Abnormal   Collection Time: 11/26/16  7:33 PM  Result Value Ref Range Status   MRSA by PCR POSITIVE (A) NEGATIVE Final    Comment:        The GeneXpert MRSA Assay (FDA approved for NASAL specimens only), is one component of a comprehensive MRSA colonization surveillance program. It is not intended to diagnose MRSA infection nor to guide or monitor treatment for MRSA infections. RESULT CALLED TO, READ BACK BY AND VERIFIED WITH: [place] [id number] AT 2[address] [facility]          Radiology Studies: Ct Head Wo Contrast  Result Date: 11/26/2016 CLINICAL DATA:  Altered mental status EXAM: CT HEAD WITHOUT CONTRAST TECHNIQUE: Contiguous axial images were obtained from the base of the skull through the vertex without intravenous contrast. COMPARISON:  None. FINDINGS: Brain: Large territory of left MCA infarction. Diffuse hypodensity throughout the left MCA territory including the left basal ganglia. This is most compatible with late subacute or chronic infarction. No prior studies available for comparison. Generalized atrophy. Chronic microvascular ischemic change in the white matter. Negative for hemorrhage. No shift of the midline structures. Vascular: Diffuse arterial calcification.  Negative for dense MCA. Skull: Negative Sinuses/Orbits: Mucosal edema in the paranasal sinuses. Calcification of the  right globe due to chronic insult. Other: None IMPRESSION: Large territory left MCA infarction which appears chronic. Atrophy and chronic ischemia. No acute hemorrhage. Electronically Signed   By: Franchot Gallo M.D.   On: 11/26/2016 12:17   Dg Chest Port 1 View  Result Date: 11/26/2016 CLINICAL DATA:  Cough, respiratory distress.  COPD. EXAM: PORTABLE CHEST 1 VIEW COMPARISON:  10/21/2016 FINDINGS: Tracheostomy in good position. Lungs are clear. Negative for pneumonia. Negative for heart failure or effusion. IMPRESSION: No active disease. Electronically Signed   By: Franchot Gallo M.D.   On: 11/26/2016 06:47        Scheduled Meds: . sodium chloride  10 mL/hr Intravenous Once  . amantadine  100 mg Oral Daily  . amLODipine  10 mg Oral Daily  . aspirin  81 mg Oral Daily  . atorvastatin  40 mg Oral Daily  . ceFEPime (MAXIPIME) IV  1 g Intravenous Q24H  . chlorhexidine gluconate (MEDLINE KIT)  15 mL Mouth Rinse BID  . Chlorhexidine Gluconate Cloth  6 each Topical Q0600  . docusate  100 mg Oral BID  . famotidine  20 mg Oral Daily  . fentaNYL  12.5 mcg Transdermal Q72H  . ferrous sulfate  300 mg Oral BID WC  . free water  200 mL Per Tube Q4H  . heparin  5,000 Units Subcutaneous Q8H  . insulin aspart  0-24 Units Subcutaneous Q4H  . insulin aspart  10 Units Subcutaneous Once  . levothyroxine  25 mcg Oral QAC breakfast  . mouth rinse  15 mL Mouth Rinse QID  . mupirocin ointment  1 application Nasal BID  . sertraline  100 mg Oral QHS  . [START ON 11/28/2016] vancomycin  500 mg Intravenous Q48H   Continuous Infusions: . feeding supplement (GLUCERNA 1.2 CAL) 1,000 mL (11/26/16 1946)  . sodium chloride 0.45 % with kcl 100 mL/hr at 11/27/16 0124     LOS: 1 day    Dron Tanna Furry, MD [facility] Pager [phone]  If 7PM-7AM, please contact night-coverage [email or web address] Password [email or web address] 11/27/2016, 11:51 AM

## 2016-11-27 NOTE — Progress Notes (Addendum)
Initial Nutrition Assessment  DOCUMENTATION CODES:   Non-severe (moderate) malnutrition in context of chronic illness, Underweight  INTERVENTION:    Continue Glucerna 1.2 at 45 ml/hr via PEG tube  TF regimen providing 1296 kcals, 70 gm protein, 869 ml of free water  NUTRITION DIAGNOSIS:   Inadequate oral intake related to inability to eat as evidenced by NPO status  GOAL:   Patient will meet greater than or equal to 90% of their needs  MONITOR:   TF tolerance, Labs, Weight trends, I & O's  REASON FOR ASSESSMENT:   Other (Comment) (Tube Feeding)  ASSESSMENT:   83 y.o. Female with PMH of chronic respiratory failure with hypoxia, status post tracheostomy and gastrostomy, hypertension, cerebrovascular accident, COPD, anemia, will, history of acute kidney injury and hyponatremia in the past as well, with recent hospitalization December 2017, presents back to the ER for altered mental status and respiratory distress.    Patient is currently on ventilator support >> trach MV: 11.6 L/min Temp (24hrs), Avg:99 F (37.2 C), Min:98.1 F (36.7 C), Max:99.6 F (37.6 C)  [facility] At baseline pt is non-verbal >> takes nothing by mouth. Glucerna 1.2 formula currently infusing at 45 ml/hr via PEG tube. Free water flushes at 200 ml every 4 hours. CBG's [id number].  Nutrition-Focused physical exam completed. Findings are moderate fat depletion, moderate muscle depletion, and no edema.   Diet Order:  Diet NPO time specified  Skin:  Reviewed, no issues  Last BM:  PTA  Height:   Ht Readings from Last 1 Encounters:  11/27/16 5' (1.524 m)    Weight:   Wt Readings from Last 1 Encounters:  11/27/16 91 lb (41.3 kg)    Ideal Body Weight:  45.4 kg  BMI:  Body mass index is 17.77 kg/m.  Estimated Nutritional Needs:   Kcal:  1180  Protein:  60-70 gm  Fluid:  >/= 1.5 L  EDUCATION NEEDS:   No education needs identified at this  time  Maureen ChattersKatie Emauri Krygier, RD, LDN Pager #: [phone] After-Hours Pager #: [phone]

## 2016-11-27 NOTE — Progress Notes (Addendum)
Inpatient Diabetes Program Recommendations  AACE/ADA: New Consensus Statement on Inpatient Glycemic Control (2015)  Target Ranges:  Prepandial:   less than 140 mg/dL      Peak postprandial:   less than 180 mg/dL (1-2 hours)      Critically ill patients:  140 - 180 mg/dL   Results for Monna FamWILLIAMS, Demeka L (MRN [id number]) as of 11/27/2016 10:59  Ref. Range 11/26/2016 23:43 11/27/2016 00:49 11/27/2016 02:15 11/27/2016 04:56 11/27/2016 07:54  Glucose-Capillary Latest Ref Range: 65 - 99 mg/dL 409143 (H) 811166 (H) 914156 (H) 280 (H) 335 (H)   Review of Glycemic Control  Current orders for Inpatient glycemic control: Novolog 0-24 units Q4hours  Inpatient Diabetes Program Recommendations:   Patient was on IV insulin for hyperglycemia while on steroids. Did not transition off IV insulin with basal SQ insulin. Glucose in the 300's this am.   Please consider Levemir 8-10 units Q 24 hours. Also d/c current SQ orders and consider starting Novolog Sensitive correction Q4 hours due to renal function.  Consider Novolog 3 units Q4 hours Tube Feed Coverage while on Tube feeds.   Thanks,  Christena DeemShannon Izacc Demeyer RN, MSN, [facility] Inpatient Diabetes Coordinator Team Pager [phone] (8a-5p)

## 2016-11-27 NOTE — Progress Notes (Signed)
CRITICAL VALUE ALERT  Critical value received:  Lactic Acid 4.4  Date of notification:  11/27/16  Time of notification:  0126  Critical value read back: yes  Nurse who received alert:  Kizzie BaneAmy Saafir Abdullah, RN  MD notified (1st page):  Hugelmeyer, A  Time of first page:  0126  MD notified (2nd page): Hugelmeyer, A  Time of second page: 0240  Responding MD:  Hugelmeyer, A   Time MD responded:  [phone]

## 2016-11-28 ENCOUNTER — Inpatient Hospital Stay (HOSPITAL_COMMUNITY): Payer: Medicare Other

## 2016-11-28 DIAGNOSIS — Z7189 Other specified counseling: Secondary | ICD-10-CM

## 2016-11-28 DIAGNOSIS — R7989 Other specified abnormal findings of blood chemistry: Secondary | ICD-10-CM

## 2016-11-28 DIAGNOSIS — Z515 Encounter for palliative care: Secondary | ICD-10-CM

## 2016-11-28 LAB — CBC
HCT: 28.4 % — ABNORMAL LOW (ref 36.0–46.0)
HEMOGLOBIN: 8.6 g/dL — AB (ref 12.0–15.0)
MCH: 32 pg (ref 26.0–34.0)
MCHC: 30.3 g/dL (ref 30.0–36.0)
MCV: 105.6 fL — ABNORMAL HIGH (ref 78.0–100.0)
Platelets: 196 10*3/uL (ref 150–400)
RBC: 2.69 MIL/uL — AB (ref 3.87–5.11)
RDW: 17.3 % — ABNORMAL HIGH (ref 11.5–15.5)
WBC: 26.5 10*3/uL — AB (ref 4.0–10.5)

## 2016-11-28 LAB — ECHOCARDIOGRAM COMPLETE
HEIGHTINCHES: 60 in
Weight: 1481.49 oz

## 2016-11-28 LAB — CULTURE, BLOOD (ROUTINE X 2)

## 2016-11-28 LAB — GLUCOSE, CAPILLARY
GLUCOSE-CAPILLARY: 164 mg/dL — AB (ref 65–99)
Glucose-Capillary: 167 mg/dL — ABNORMAL HIGH (ref 65–99)
Glucose-Capillary: 169 mg/dL — ABNORMAL HIGH (ref 65–99)
Glucose-Capillary: 175 mg/dL — ABNORMAL HIGH (ref 65–99)
Glucose-Capillary: 200 mg/dL — ABNORMAL HIGH (ref 65–99)
Glucose-Capillary: 208 mg/dL — ABNORMAL HIGH (ref 65–99)

## 2016-11-28 LAB — BASIC METABOLIC PANEL
Anion gap: 7 (ref 5–15)
BUN: 85 mg/dL — ABNORMAL HIGH (ref 6–20)
CHLORIDE: 119 mmol/L — AB (ref 101–111)
CO2: 19 mmol/L — ABNORMAL LOW (ref 22–32)
Calcium: 8.8 mg/dL — ABNORMAL LOW (ref 8.9–10.3)
Creatinine, Ser: 1.73 mg/dL — ABNORMAL HIGH (ref 0.44–1.00)
GFR, EST AFRICAN AMERICAN: 30 mL/min — AB (ref >60)
GFR, EST NON AFRICAN AMERICAN: 26 mL/min — AB (ref >60)
Glucose, Bld: 202 mg/dL — ABNORMAL HIGH (ref 65–99)
POTASSIUM: 5.2 mmol/L — AB (ref 3.5–5.1)
SODIUM: 145 mmol/L (ref 135–145)

## 2016-11-28 LAB — CULTURE, RESPIRATORY

## 2016-11-28 LAB — CULTURE, RESPIRATORY W GRAM STAIN

## 2016-11-28 NOTE — Consult Note (Signed)
Consultation Note Date: 11/28/2016   Patient Name: Evelyn [name]  DOB: 06-Aug-1930  MRN: [id number]  Age / Sex: 83 y.o., female  PCP: Pcp Not In System Referring Physician: Rosita Fire, MD  Reason for Consultation: Establishing goals of care  HPI/Patient Profile: 83 y.o. female  with past medical history of HTN, CVA, COPD (chronic resp failure w/ hypoxia, trach), PEG with continuous feeding, anemia, admitted on 11/26/2016 with altered mental status and respiratory distress.    Clinical Assessment and Goals of Care: Met with patient's son, Elenore Rota. Patient lives in SNF- baseline is nonverbal, non ambulatory. Elenore Rota tells me she has been this way since her CVA he believes in September. Palliative medicine met with Elenore Rota during patient's last admission and at that time he was very reluctant to engage. He has low health literacy and tells me he is looking for a one story house so that "when his mom gets better she won't have to climb stairs". I reviewed with Elenore Rota that Mom is not likely to come home with him and may not survive this hospitalization.  Discussed her poor quality of life not being able to enjoy things that she did before (eating fish, laughing with her family, snuggling with her dogs). Discussed code status and the fact that a full code would be more harmful than good and would not cure her overarching problems of disability, vascular dementia.  Elenore Rota is receptive but says he can't make any decisions without his brother, Eulas Post (also goes by [name]) being here. Eulas Post is in the process of moving here from [place]. Elenore Rota gave me Glenn's phone number.   Primary Decision Maker  NEXT OF KIN sons- Elenore Rota and Eulas Post    SUMMARY OF RECOMMENDATIONS -Continue current level of care -PMT will attempt to contact Hulan Amato) and further discuss [place]    Code Status/Advance Care Planning:  Full  code  Palliative Prophylaxis:   Frequent Pain Assessment, Oral Care and Turn Reposition  Additional Recommendations (Limitations, Scope, Preferences):  Full Scope Treatment  Prognosis:    Unable to determine  Discharge Planning: To Be Determined  Primary Diagnoses: Present on Admission: . Hyperglycemia . Anemia . AKI (acute kidney injury) ([place]) . Chronic obstructive pulmonary disease ([place]) . Hypertension . Hypernatremia . Hyperlipidemia . DM (diabetes mellitus) type II controlled peripheral vascular disorder ([place])   I have reviewed the medical record, interviewed the patient and family, and examined the patient. The following aspects are pertinent.  Past Medical History:  Diagnosis Date  . Arthritis   . Cognitive communication deficit   . COPD (chronic obstructive pulmonary disease) ([place])   . Dysphagia   . Encephalopathy   . Flaccid hemiplegia ([place])   . Hyperlipidemia   . Hypertension   . Iron deficiency anemia   . Stroke ([place])   . Thyroid disease    Social History   Social History  . Marital status: Widowed    Spouse name: N/A  . Number of children: N/A  . Years of education: N/A   Social  History Main Topics  . Smoking status: Unknown If Ever Smoked  . Smokeless tobacco: Not on file  . Alcohol use Not on file  . Drug use: Unknown  . Sexual activity: Not on file   Other Topics Concern  . Not on file   Social History Narrative  . No narrative on file   No family history on file. Scheduled Meds: . sodium chloride  10 mL/hr Intravenous Once  . amantadine  100 mg Oral Daily  . aspirin  81 mg Oral Daily  . atorvastatin  40 mg Oral Daily  . chlorhexidine gluconate (MEDLINE KIT)  15 mL Mouth Rinse BID  . Chlorhexidine Gluconate Cloth  6 each Topical Q0600  . docusate  100 mg Oral BID  . famotidine  20 mg Oral Daily  . fentaNYL  12.5 mcg Transdermal Q72H  . ferrous sulfate  300 mg Oral BID WC  . free water  200 mL Per Tube Q4H  . heparin  5,000  Units Subcutaneous Q8H  . insulin aspart  0-24 Units Subcutaneous Q4H  . insulin aspart  10 Units Subcutaneous Once  . insulin aspart  4 Units Subcutaneous TID WC  . insulin detemir  10 Units Subcutaneous QHS  . levothyroxine  25 mcg Oral QAC breakfast  . mouth rinse  15 mL Mouth Rinse QID  . mupirocin ointment  1 application Nasal BID  . pencillin G potassium IV  2 Million Units Intravenous Q4H  . sertraline  100 mg Oral QHS   Continuous Infusions: . sodium chloride 75 mL/hr at 11/28/16 0507  . feeding supplement (GLUCERNA 1.2 CAL) 1,000 mL (11/27/16 1835)   PRN Meds:.acetaminophen **OR** acetaminophen, guaifenesin, HYDROcodone-acetaminophen, ipratropium-albuterol Medications Prior to Admission:  Prior to Admission medications   Medication Sig Start Date End Date Taking? Authorizing Provider  amantadine (SYMMETREL) 100 MG capsule Take 100 mg by mouth daily.   Yes Historical Provider, MD  amLODipine (NORVASC) 10 MG tablet Take 10 mg by mouth daily.   Yes Historical Provider, MD  aspirin 81 MG chewable tablet Chew 81 mg by mouth daily.   Yes Historical Provider, MD  atorvastatin (LIPITOR) 40 MG tablet Take 40 mg by mouth daily.   Yes Historical Provider, MD  docusate (COLACE) 50 MG/5ML liquid Take 100 mg by mouth 2 (two) times daily.   Yes Historical Provider, MD  famotidine (PEPCID) 20 MG tablet Take 20 mg by mouth daily.   Yes Historical Provider, MD  ferrous sulfate 300 (60 Fe) MG/5ML syrup Take 300 mg by mouth 2 (two) times daily with a meal.   Yes Historical Provider, MD  guaifenesin (ROBITUSSIN) 100 MG/5ML syrup Take 200 mg by mouth every 8 (eight) hours as needed for congestion.   Yes Historical Provider, MD  insulin aspart (NOVOLOG) 100 UNIT/ML injection Inject 10 Units into the skin once.   Yes Historical Provider, MD  ipratropium-albuterol (DUONEB) 0.5-2.5 (3) MG/3ML SOLN Take 3 mLs by nebulization every 8 (eight) hours as needed (wheezing).   Yes Historical Provider, MD   levothyroxine (SYNTHROID, LEVOTHROID) 25 MCG tablet Take 25 mcg by mouth daily before breakfast.   Yes Historical Provider, MD  Multiple Vitamin (MULTIVITAMIN WITH MINERALS) TABS tablet Take 1 tablet by mouth daily.   Yes Historical Provider, MD  Nutritional Supplements (GLUCERNA 1.5 CAL PO) Place 80 mLs into feeding tube See admin instructions. 80 ml per hour for 15 hours 2p-5a   Yes Historical Provider, MD  oseltamivir (TAMIFLU) 75 MG capsule Take 75 mg by  mouth 2 (two) times daily. For 10 days. Started on 11-23-16   Yes Historical Provider, MD  sertraline (ZOLOFT) 100 MG tablet Take 100 mg by mouth at bedtime.   Yes Historical Provider, MD  Water For Irrigation, Sterile (FREE WATER) SOLN Place 200 mLs into feeding tube every 4 (four) hours. 10/24/16  Yes Lorella Nimrod, MD  fentaNYL (DURAGESIC - DOSED MCG/HR) 12 MCG/HR Place 12.5 mcg onto the skin every 3 (three) days.    Historical Provider, MD   Allergies  Allergen Reactions  . Erythromycin Itching   Review of Systems  Unable to perform ROS: Patient unresponsive    Physical Exam  Constitutional:  cachetic  Musculoskeletal:  Muscle wasting present  Neurological:  nonresponsive  Nursing note reviewed.   Vital Signs: BP (!) 92/51 (BP Location: Right Arm)   Pulse 82   Temp 100.2 F (37.9 C) (Axillary) Comment: notified nurse  Resp 20   Ht 5' (1.524 m)   Wt 42 kg (92 lb 9.5 oz)   SpO2 100%   BMI 18.08 kg/m  Pain Assessment: CPOT   Pain Score: Asleep   SpO2: SpO2: 100 % O2 Device:SpO2: 100 % O2 Flow Rate: .O2 Flow Rate (L/min): 30 L/min  IO: Intake/output summary:  Intake/Output Summary (Last 24 hours) at 11/28/16 1346 Last data filed at 11/28/16 5456  Gross per 24 hour  Intake          3476.25 ml  Output                0 ml  Net          3476.25 ml    LBM: Last BM Date:  (do not know when last bm) Baseline Weight: Weight: 44.7 kg (98 lb 8.7 oz) Most recent weight: Weight: 42 kg (92 lb 9.5 oz)     Palliative  Assessment/Data: PPS: 10%   Flowsheet Rows   Flowsheet Row Most Recent Value  Intake Tab  Referral Department  Hospitalist  Unit at Time of Referral  Intermediate Care Unit  Palliative Care Primary Diagnosis  Pulmonary  Date Notified  11/27/16  Palliative Care Type  New Palliative care  Reason for referral  Clarify Goals of Care  Date of Admission  11/26/16  # of days IP prior to Palliative referral  1  Clinical Assessment  Psychosocial & Spiritual Assessment  Palliative Care Outcomes      Thank you for this consult. Palliative medicine will continue to follow and assist as needed.   Time Total: 90 minutes  Greater than 50%  of this time was spent counseling and coordinating care related to the above assessment and plan.  Signed by: Mariana Kaufman, AGNP-C Palliative Medicine    Please contact Palliative Medicine Team phone at [phone] for questions and concerns.  For individual provider: See Shea Evans

## 2016-11-28 NOTE — Progress Notes (Signed)
[facility]   Reason for visit: Follow up on GBS bacteremia  Interval History: palliative care discussions ongoing, son at bedside; afebrile, WBC up again to 26, patient without any response  Physical Exam: Constitutional:  Vitals:   11/28/16 0931 11/28/16 1142  BP: (!) 106/52 (!) 92/51  Pulse:  82  Resp:  20  Temp:  100.2 F (37.9 C)   patient appears in NAD but unresponsive, eyes open Respiratory: Normal respiratory effort; CTA B, anterior exam Cardiovascular: RRR GI: soft, nt  Review of Systems: Unable to be assessed due to mental status  Lab Results  Component Value Date   WBC 26.5 (H) 11/28/2016   HGB 8.6 (L) 11/28/2016   HCT 28.4 (L) 11/28/2016   MCV 105.6 (H) 11/28/2016   PLT 196 11/28/2016    Lab Results  Component Value Date   CREATININE 1.73 (H) 11/28/2016   BUN 85 (H) 11/28/2016   NA 145 11/28/2016   K 5.2 (H) 11/28/2016   CL 119 (H) 11/28/2016   CO2 19 (L) 11/28/2016    Lab Results  Component Value Date   ALT 40 11/27/2016   AST 40 11/27/2016   ALKPHOS 313 (H) 11/27/2016     Microbiology: Recent Results (from the past 240 hour(s))  Blood Culture (routine x 2)     Status: Abnormal   Collection Time: 11/26/16  5:44 AM  Result Value Ref Range Status   Specimen Description BLOOD LEFT HAND  Final   Special Requests IN PEDIATRIC BOTTLE 1CC  Final   Culture  Setup Time   Final    GRAM POSITIVE COCCI IN CLUSTERS IN PEDIATRIC BOTTLE CRITICAL RESULT CALLED TO, READ BACK BY AND VERIFIED WITH: RUTH CLARK,PHARMD AT 1610 11/27/16 G.MCADOO    Culture (A)  Final    GROUP B STREP(S.AGALACTIAE)ISOLATED SUSCEPTIBILITIES PERFORMED ON PREVIOUS CULTURE WITHIN THE LAST 5 DAYS.    Report Status 11/28/2016 FINAL  Final  Blood Culture (routine x 2)     Status: Abnormal   Collection Time: 11/26/16  6:00 AM  Result Value Ref Range Status   Specimen Description BLOOD RIGHT FOREARM  Final   Special Requests BOTTLES DRAWN AEROBIC AND  ANAEROBIC 5CC  Final   Culture  Setup Time   Final    GRAM POSITIVE COCCI IN CHAINS IN BOTH AEROBIC AND ANAEROBIC BOTTLES CRITICAL RESULT CALLED TO, READ BACK BY AND VERIFIED WITH: A JOHNSTON,PHARMD AT 2058 11/26/16 BY J FUDESCO    Culture GROUP B STREP(S.AGALACTIAE)ISOLATED (A)  Final   Report Status 11/28/2016 FINAL  Final   Organism ID, Bacteria GROUP B STREP(S.AGALACTIAE)ISOLATED  Final      Susceptibility   Group b strep(s.agalactiae)isolated - MIC*    CLINDAMYCIN <=0.25 SENSITIVE Sensitive     AMPICILLIN <=0.25 SENSITIVE Sensitive     ERYTHROMYCIN <=0.12 SENSITIVE Sensitive     VANCOMYCIN 0.5 SENSITIVE Sensitive     CEFTRIAXONE <=0.12 SENSITIVE Sensitive     LEVOFLOXACIN 1 SENSITIVE Sensitive     * GROUP B STREP(S.AGALACTIAE)ISOLATED  Blood Culture ID Panel (Reflexed)     Status: Abnormal   Collection Time: 11/26/16  6:00 AM  Result Value Ref Range Status   Enterococcus species NOT DETECTED NOT DETECTED Final   Listeria monocytogenes NOT DETECTED NOT DETECTED Final   Staphylococcus species NOT DETECTED NOT DETECTED Final   Staphylococcus aureus NOT DETECTED NOT DETECTED Final   Streptococcus species DETECTED (A) NOT DETECTED Final    Comment: CRITICAL RESULT CALLED TO, READ  BACK BY AND VERIFIED WITH: A JOHNSTON, PHARM, 11/26/16 AT 2058 BY J FUDESCO    Streptococcus agalactiae DETECTED (A) NOT DETECTED Final    Comment: CRITICAL RESULT CALLED TO, READ BACK BY AND VERIFIED WITH: A JOHNSTON, PHARM, 11/26/16 AT 2058 BY J FUDESCO    Streptococcus pneumoniae NOT DETECTED NOT DETECTED Final   Streptococcus pyogenes NOT DETECTED NOT DETECTED Final   Acinetobacter baumannii NOT DETECTED NOT DETECTED Final   Enterobacteriaceae species NOT DETECTED NOT DETECTED Final   Enterobacter cloacae complex NOT DETECTED NOT DETECTED Final   Escherichia coli NOT DETECTED NOT DETECTED Final   Klebsiella oxytoca NOT DETECTED NOT DETECTED Final   Klebsiella pneumoniae NOT DETECTED NOT DETECTED  Final   Proteus species NOT DETECTED NOT DETECTED Final   Serratia marcescens NOT DETECTED NOT DETECTED Final   Haemophilus influenzae NOT DETECTED NOT DETECTED Final   Neisseria meningitidis NOT DETECTED NOT DETECTED Final   Pseudomonas aeruginosa NOT DETECTED NOT DETECTED Final   Candida albicans NOT DETECTED NOT DETECTED Final   Candida glabrata NOT DETECTED NOT DETECTED Final   Candida krusei NOT DETECTED NOT DETECTED Final   Candida parapsilosis NOT DETECTED NOT DETECTED Final   Candida tropicalis NOT DETECTED NOT DETECTED Final  Culture, respiratory     Status: None   Collection Time: 11/26/16  9:24 AM  Result Value Ref Range Status   Specimen Description TRACHEAL ASPIRATE  Final   Special Requests NONE  Final   Gram Stain   Final    RARE WBC PRESENT, PREDOMINANTLY PMN RARE GRAM POSITIVE COCCI IN CLUSTERS RARE GRAM POSITIVE RODS    Culture   Final    ABUNDANT METHICILLIN RESISTANT STAPHYLOCOCCUS AUREUS   Report Status 11/28/2016 FINAL  Final   Organism ID, Bacteria METHICILLIN RESISTANT STAPHYLOCOCCUS AUREUS  Final      Susceptibility   Methicillin resistant staphylococcus aureus - MIC*    CIPROFLOXACIN >=8 RESISTANT Resistant     ERYTHROMYCIN >=8 RESISTANT Resistant     GENTAMICIN <=0.5 SENSITIVE Sensitive     OXACILLIN >=4 RESISTANT Resistant     TETRACYCLINE <=1 SENSITIVE Sensitive     VANCOMYCIN <=0.5 SENSITIVE Sensitive     TRIMETH/SULFA >=320 RESISTANT Resistant     CLINDAMYCIN <=0.25 SENSITIVE Sensitive     RIFAMPIN <=0.5 SENSITIVE Sensitive     Inducible Clindamycin NEGATIVE Sensitive     * ABUNDANT METHICILLIN RESISTANT STAPHYLOCOCCUS AUREUS  Urine culture     Status: Abnormal   Collection Time: 11/26/16 10:08 AM  Result Value Ref Range Status   Specimen Description URINE, RANDOM  Final   Special Requests NONE  Final   Culture <10,000 COLONIES/mL INSIGNIFICANT GROWTH (A)  Final   Report Status 11/27/2016 FINAL  Final  MRSA PCR Screening     Status: Abnormal    Collection Time: 11/26/16  7:33 PM  Result Value Ref Range Status   MRSA by PCR POSITIVE (A) NEGATIVE Final    Comment:        The GeneXpert MRSA Assay (FDA approved for NASAL specimens only), is one component of a comprehensive MRSA colonization surveillance program. It is not intended to diagnose MRSA infection nor to guide or monitor treatment for MRSA infections. RESULT CALLED TO, READ BACK BY AND VERIFIED WITHFerman Hamming RN [id number] AT 2302 SKEEN,P     Impression/Plan:  1. GBS bacteremia - on penicillin.  With bacteremia only needs about 2 weeks if endocarditis ruled out, but need TEE for that.   TTE without vegetation.  picc  line ok once blood cultures from today negative at least 48 hours   2.  dispo - ongoing discussion with palliative care.

## 2016-11-28 NOTE — NC FL2 (Signed)
[place] LEVEL OF CARE SCREENING TOOL     IDENTIFICATION  Patient Name: Evelyn [name] Birthdate: April 02, 1930 Sex: female Admission Date (Current Location): 11/26/2016  [facility] and [place] Number:  Herbalist and Address:  [facility], [place] [address], [place], [place] [address]      Provider Number: [id number]  Attending Physician Name and Address:  Rosita Fire, MD  Relative Name and Phone Number:       Current Level of Care: Hospital Recommended Level of Care: [place] Prior Approval Number:    Date Approved/Denied:   PASRR Number:    Discharge Plan: SNF    Current Diagnoses: Patient Active Problem List   Diagnosis Date Noted  . Malnutrition of moderate degree 11/27/2016  . Sepsis due to Streptococcus agalactiae ([place])   . Tracheostomy status ([place])   . Dehydration   . Hyperglycemia 11/26/2016  . DM (diabetes mellitus) type II controlled peripheral vascular disorder ([place]) 11/26/2016  . Acute metabolic encephalopathy 02/72/5366  . Acute on chronic respiratory failure ([place]) 11/26/2016  . Acute respiratory disease   . Septic shock ([place])   . Acute on chronic respiratory failure with hypoxia ([place])   . Pressure injury of skin 10/23/2016  . Goals of care, counseling/discussion   . Palliative care encounter   . AKI (acute kidney injury) ([place]) 10/19/2016  . Hypertension 10/19/2016  . Chronic obstructive pulmonary disease ([place]) 10/19/2016  . Hyperlipidemia 10/19/2016  . Anemia 10/19/2016  . Hypernatremia 10/19/2016  . Hypokalemia 10/19/2016  . Chronic respiratory failure with hypoxia ([place]) 10/19/2016  . Status post tracheostomy ([place]) 10/19/2016  . Gastrostomy tube dependent ([place]) 10/19/2016    Orientation RESPIRATION BLADDER Height & Weight      (DOx4)  Tracheostomy (40m) Incontinent Weight: 92 lb 9.5 oz (42 kg) Height:  5' (152.4 cm)  BEHAVIORAL SYMPTOMS/MOOD NEUROLOGICAL BOWEL NUTRITION  STATUS      Incontinent    AMBULATORY STATUS COMMUNICATION OF NEEDS Skin   Total Care Verbally Other (Comment) (pressure injury on sacrum)                       Personal Care Assistance Level of Assistance  Total care       Total Care Assistance: Maximum assistance   Functional Limitations Info             SPECIAL CARE FACTORS FREQUENCY  PT (By licensed PT), OT (By licensed OT)     PT Frequency: 5/wk OT Frequency: 5/wk            Contractures      Additional Factors Info  Code Status, Allergies, Isolation Precautions Code Status Info: FULL Allergies Info: erythromycin     Isolation Precautions Info: MRSA     Current Medications (11/28/2016):  This is the current hospital active medication list Current Facility-Administered Medications  Medication Dose Route Frequency Provider Last Rate Last Dose  . 0.45 % sodium chloride infusion   Intravenous Continuous Dron PTanna Furry MD 75 mL/hr at 11/28/16 0507    . 0.9 %  sodium chloride infusion  10 mL/hr Intravenous Once AVarney Biles MD      . acetaminophen (TYLENOL) tablet 650 mg  650 mg Oral Q6H PRN MBrenton Grills PA-C       Or  . acetaminophen (TYLENOL) suppository 650 mg  650 mg Rectal Q6H PRN MBrenton Grills PA-C      . amantadine (SYMMETREL) capsule 100  mg  100 mg Oral Daily Brenton Grills, PA-C   100 mg at 11/28/16 0919  . amLODipine (NORVASC) tablet 10 mg  10 mg Oral Daily Brenton Grills, PA-C   10 mg at 11/28/16 8502  . aspirin chewable tablet 81 mg  81 mg Oral Daily Brenton Grills, PA-C   81 mg at 11/28/16 7741  . atorvastatin (LIPITOR) tablet 40 mg  40 mg Oral Daily Brenton Grills, PA-C   40 mg at 11/28/16 0940  . chlorhexidine gluconate (MEDLINE KIT) (PERIDEX) 0.12 % solution 15 mL  15 mL Mouth Rinse BID Maren Reamer, MD   15 mL at 11/28/16 0845  . Chlorhexidine Gluconate Cloth 2 % PADS 6 each  6 each Topical Q0600 Maren Reamer, MD   6 each at 11/28/16 [phone]  .  docusate (COLACE) 50 MG/5ML liquid 100 mg  100 mg Oral BID Brenton Grills, PA-C   100 mg at 11/28/16 0913  . famotidine (PEPCID) tablet 20 mg  20 mg Oral Daily Brenton Grills, PA-C   20 mg at 11/28/16 6767  . feeding supplement (GLUCERNA 1.2 CAL) liquid 1,000 mL  1,000 mL Per Tube Continuous Brenton Grills, PA-C 45 mL/hr at 11/27/16 1835 1,000 mL at 11/27/16 1835  . fentaNYL (DURAGESIC - dosed mcg/hr) 12.5 mcg  12.5 mcg Transdermal Q72H Brenton Grills, PA-C   12.5 mcg at 11/26/16 1829  . ferrous sulfate 300 (60 Fe) MG/5ML syrup 300 mg  300 mg Oral BID WC Brenton Grills, PA-C   300 mg at 11/28/16 0855  . free water 200 mL  200 mL Per Tube Q4H Brenton Grills, PA-C   200 mL at 11/28/16 0916  . guaifenesin (ROBITUSSIN) 100 MG/5ML syrup 200 mg  200 mg Oral Q8H PRN Brenton Grills, PA-C      . heparin injection 5,000 Units  5,000 Units Subcutaneous [address] [place], PA-C   5,000 Units at 11/28/16 0451  . HYDROcodone-acetaminophen (NORCO/VICODIN) 5-325 MG per tablet 1-2 tablet  1-2 tablet Oral Q4H PRN Brenton Grills, PA-C   2 tablet at 11/26/16 2335  . insulin aspart (novoLOG) injection 0-24 Units  0-24 Units Subcutaneous Q4H Dron Tanna Furry, MD   4 Units at 11/28/16 [phone]  . insulin aspart (novoLOG) injection 10 Units  10 Units Subcutaneous Once Marina S Kyazimova, PA-C      . insulin aspart (novoLOG) injection 4 Units  4 Units Subcutaneous TID WC Dron Tanna Furry, MD   4 Units at 11/28/16 [phone]  . insulin detemir (LEVEMIR) injection 10 Units  10 Units Subcutaneous QHS Dron Tanna Furry, MD   10 Units at 11/27/16 2120  . ipratropium-albuterol (DUONEB) 0.5-2.5 (3) MG/3ML nebulizer solution 3 mL  3 mL Nebulization Q8H PRN Brenton Grills, PA-C      . levothyroxine (SYNTHROID, LEVOTHROID) tablet 25 mcg  25 mcg Oral QAC breakfast Brenton Grills, PA-C   25 mcg at 11/28/16 0859  . MEDLINE mouth rinse  15 mL Mouth Rinse QID Maren Reamer, MD   15 mL at  11/28/16 0423  . mupirocin ointment (BACTROBAN) 2 % 1 application  1 application Nasal BID Maren Reamer, MD   1 application at 28/36/62 [phone]  . penicillin G potassium 2 Million Units in dextrose 5 % 50 mL IVPB  2 Million Units Intravenous Q4H Thayer Headings, MD   2 Million Units at 11/28/16 [phone]  . sertraline (ZOLOFT) tablet 100 mg  100 mg Oral QHS Brenton Grills, PA-C   100 mg at 11/27/16 2119     Discharge Medications: Please see discharge summary for a list of discharge medications.  Relevant Imaging Results:  Relevant Lab Results:   Additional Information    Jorge Ny, LCSW

## 2016-11-28 NOTE — Progress Notes (Signed)
  Echocardiogram 2D Echocardiogram has been performed.  Evelyn [name], Tony 11/28/2016, 12:49 PM

## 2016-11-28 NOTE — Progress Notes (Signed)
PROGRESS NOTE    Evelyn [name]  ZOX:[id number] DOB: June 04, 1930 DOA: 11/26/2016 PCP: Pcp Not In System   Brief Narrative: 83 y.o. female with medical history significant of hypertension, CVA, COPD with chronic respiratory failure with hypoxia, status post try tracheostomy, status post gastrostomy, anemia, previous history of acute kidney injury and hypernatremia during the last hospitalization in December 2017 who presented to them emergency department with altered mental status and respiratory distress. She is  A resident of the SNF and at baseline is not verbal, but during the last 24 hours she became less responsive and her her breathing became more labored.  Assessment & Plan:   # Acute metabolic encephalopathy: Likely in the setting of UTI and bacteremia. CT scan of head with chronic stroke. No acute changes. -Patient is currently on trach, has PEG tube -Patient is nonverbal at baseline. I believe this is patient's baseline mental status.   #Goals of care: Patient with chronic comorbidities, nonverbal, has trach and PEG.She is very minimally responsive to painful stimuli. Continue to monitor. Palliative care following. Plan for a family meeting today. I believe patient will benefit from comfort measures and possibly hospice care at this time. -Patient with multiple hospitalization and very poor quality of life.  # Sepsis due to streptococcus bacteremia: Patient with elevated lactate level, leukocytosis on admission.  -Infectious disease consult appreciated, antibiotics narrowed down to penicillin. Leukocytosis is worsened today. Continue to monitor labs and follow-up ideation condition. Follow up final culture results. Blood cultures repeated on 1/30.  #Acute on chronic respiratory failure, hypoxemic: Patient was on ventilator on admission. Pulmonary critical care service evaluated the patient. Currently on trach collar with high flow oxygen. Respiratory therapist evaluation and  treatment appreciated.  -Chest x-ray with no active disease. Continue to monitor. Patient has chronic trach.  # Hypernatremia - recurrent problem, -Serum sodium level is improving. Continue half NS and free water through the tube. Continue to monitor electrolytes. Serum sodium level CXLV today.  # Uncontrolled DM type II with Hyperglycemia:  HgbA1C 7.9 -Continue current insulin regimen. Monitor blood sugar level. Avoid hypoglycemic episode.  # Acute on chronic kidney disease: Likely prerenal and UTI -Serum creatinine level is improving to 1.7 today. Monitor BMP.              # History of HTN: Patient is hypotensive today. I will discontinue amlodipine.  # Hypothyroidism Continue synthroid  # Elevated troponin likely in the setting of sepsis and renal failure. Pending echocardiogram.   Principal Problem:   Acute metabolic encephalopathy Active Problems:   AKI (acute kidney injury) ([place])   Hypertension   Chronic obstructive pulmonary disease (HCC)   Hyperlipidemia   Anemia   Hypernatremia   Gastrostomy tube dependent (HCC)   Hyperglycemia   DM (diabetes mellitus) type II controlled peripheral vascular disorder (HCC)   Acute on chronic respiratory failure (HCC)   Malnutrition of moderate degree   Sepsis due to Streptococcus agalactiae (HCC)   Tracheostomy status (HCC)   Dehydration  DVT prophylaxis: Heparin subcutaneous. Code Status: Full code Family Communication: No family present at bedside Disposition Plan: Likely discharge to nursing home in  few days. Depending on clinical improvement and palliative care consults evaluation.    Consultants:   palliative care and critical care.  Procedures: Mechanical ventilator Antimicrobials:  Vancomycin and cefepime on 1/28.  Started penicillin since 1/29  Subjective: Patient was seen and examined at bedside. Patient is nonverbal and very minimally responsive to painful is to monitor. Not  on ventilator  now. Objective: Vitals:   11/28/16 0600 11/28/16 0749 11/28/16 0800 11/28/16 0931  BP: (!) 103/53 (!) 107/53 (!) 100/53 (!) 106/52  Pulse:  73    Resp: (!) 26 (!) 25 (!) 25   Temp:  99.4 F (37.4 C) 99.6 F (37.6 C)   TempSrc:  Axillary Oral   SpO2:   100%   Weight:      Height:        Intake/Output Summary (Last 24 hours) at 11/28/16 1141 Last data filed at 11/28/16 0916  Gross per 24 hour  Intake          3976.25 ml  Output                0 ml  Net          3976.25 ml   Filed Weights   11/27/16 0357 11/27/16 1138 11/28/16 0400  Weight: 41.5 kg (91 lb 7.9 oz) 41.3 kg (91 lb) 42 kg (92 lb 9.5 oz)    Examination:  General exam: Ill-looking female somnolent, lethargic, Very minimally responsive to painful stimuli Tracheostomy site clean, not on ventilator.  Respiratory system: Clear bilateral, no wheezing or crackle. Cardiovascular system: S1 & S2 heard, RRR.  No pedal edema. Gastrointestinal system: Abdomen is nondistended, soft. Normal bowel sounds heard. PEG site clean. Central nervous system: Lethargic Extremities: No edema Skin: No rashes, lesions or ulcers Psychiatry: Lethargic, not responsive.    Data Reviewed: I have personally reviewed following labs and imaging studies  CBC:  Recent Labs Lab 11/26/16 0639 11/26/16 0835 11/26/16 0847 11/26/16 1457 11/27/16 0222 11/28/16 0523  WBC 9.1 20.4*  --  21.6* 8.7 26.5*  NEUTROABS 7.8* 18.2*  --   --   --   --   HGB 5.3* 9.3* 9.5* 8.6* 8.5* 8.6*  HCT 17.5* 31.5* 28.0* 28.4* 28.3* 28.4*  MCV 109.4* 108.6*  --  106.0* 106.4* 105.6*  PLT 140* 300  --  279 205 540   Basic Metabolic Panel:  Recent Labs Lab 11/26/16 2002 11/27/16 0210 11/27/16 0222 11/27/16 0750 11/28/16 0523  NA 159* 154* 155* 150* 145  K 3.2* 3.5 3.5 5.3* 5.2*  CL 128* 126* 129* 125* 119*  CO2 20* 16* 18* 18* 19*  GLUCOSE 217* 165* 164* 401* 202*  BUN 119* 112* 109* 109* 85*  CREATININE 2.28* 2.09* 2.05* 2.06* 1.73*  CALCIUM 9.2  8.9 8.7* 8.4* 8.8*   GFR: Estimated Creatinine Clearance: 16.1 mL/min (by C-G formula based on SCr of 1.73 mg/dL (H)). Liver Function Tests:  Recent Labs Lab 11/26/16 0835 11/27/16 0222  AST 50* 40  ALT 48 40  ALKPHOS 279* 313*  BILITOT 0.7 0.3  PROT 7.4 6.3*  ALBUMIN 2.2* 1.9*   No results for input(s): LIPASE, AMYLASE in the last 168 hours. No results for input(s): AMMONIA in the last 168 hours. Coagulation Profile: No results for input(s): INR, PROTIME in the last 168 hours. Cardiac Enzymes:  Recent Labs Lab 11/26/16 0835 11/26/16 1457 11/26/16 2002 11/27/16 0210  TROPONINI 0.52* 0.37* 0.33* 0.30*   BNP (last 3 results) No results for input(s): PROBNP in the last 8760 hours. HbA1C:  Recent Labs  11/26/16 1457  HGBA1C 7.9*   CBG:  Recent Labs Lab 11/27/16 1716 11/27/16 2012 11/28/16 0052 11/28/16 0414 11/28/16 0753  GLUCAP 137* 223* 200* 208* 167*   Lipid Profile: No results for input(s): CHOL, HDL, LDLCALC, TRIG, CHOLHDL, LDLDIRECT in the last 72 hours. Thyroid Function Tests: No results  for input(s): TSH, T4TOTAL, FREET4, T3FREE, THYROIDAB in the last 72 hours. Anemia Panel: No results for input(s): VITAMINB12, FOLATE, FERRITIN, TIBC, IRON, RETICCTPCT in the last 72 hours. Sepsis Labs:  Recent Labs Lab 11/26/16 2358 11/27/16 0210 11/27/16 0750 11/27/16 1038  LATICACIDVEN 4.4* 2.5* 1.0 3.7*    Recent Results (from the past 240 hour(s))  Blood Culture (routine x 2)     Status: Abnormal   Collection Time: 11/26/16  5:44 AM  Result Value Ref Range Status   Specimen Description BLOOD LEFT HAND  Final   Special Requests IN PEDIATRIC BOTTLE 1CC  Final   Culture  Setup Time   Final    GRAM POSITIVE COCCI IN CLUSTERS IN PEDIATRIC BOTTLE CRITICAL RESULT CALLED TO, READ BACK BY AND VERIFIED WITH: RUTH CLARK,PHARMD AT 4403 11/27/16 G.MCADOO    Culture (A)  Final    GROUP B STREP(S.AGALACTIAE)ISOLATED SUSCEPTIBILITIES PERFORMED ON PREVIOUS  CULTURE WITHIN THE LAST 5 DAYS.    Report Status 11/28/2016 FINAL  Final  Blood Culture (routine x 2)     Status: Abnormal   Collection Time: 11/26/16  6:00 AM  Result Value Ref Range Status   Specimen Description BLOOD RIGHT FOREARM  Final   Special Requests BOTTLES DRAWN AEROBIC AND ANAEROBIC 5CC  Final   Culture  Setup Time   Final    GRAM POSITIVE COCCI IN CHAINS IN BOTH AEROBIC AND ANAEROBIC BOTTLES CRITICAL RESULT CALLED TO, READ BACK BY AND VERIFIED WITH: A JOHNSTON,PHARMD AT 2058 11/26/16 BY J FUDESCO    Culture GROUP B STREP(S.AGALACTIAE)ISOLATED (A)  Final   Report Status 11/28/2016 FINAL  Final   Organism ID, Bacteria GROUP B STREP(S.AGALACTIAE)ISOLATED  Final      Susceptibility   Group b strep(s.agalactiae)isolated - MIC*    CLINDAMYCIN <=0.25 SENSITIVE Sensitive     AMPICILLIN <=0.25 SENSITIVE Sensitive     ERYTHROMYCIN <=0.12 SENSITIVE Sensitive     VANCOMYCIN 0.5 SENSITIVE Sensitive     CEFTRIAXONE <=0.12 SENSITIVE Sensitive     LEVOFLOXACIN 1 SENSITIVE Sensitive     * GROUP B STREP(S.AGALACTIAE)ISOLATED  Blood Culture ID Panel (Reflexed)     Status: Abnormal   Collection Time: 11/26/16  6:00 AM  Result Value Ref Range Status   Enterococcus species NOT DETECTED NOT DETECTED Final   Listeria monocytogenes NOT DETECTED NOT DETECTED Final   Staphylococcus species NOT DETECTED NOT DETECTED Final   Staphylococcus aureus NOT DETECTED NOT DETECTED Final   Streptococcus species DETECTED (A) NOT DETECTED Final    Comment: CRITICAL RESULT CALLED TO, READ BACK BY AND VERIFIED WITH: A JOHNSTON, PHARM, 11/26/16 AT 2058 BY J FUDESCO    Streptococcus agalactiae DETECTED (A) NOT DETECTED Final    Comment: CRITICAL RESULT CALLED TO, READ BACK BY AND VERIFIED WITH: A JOHNSTON, PHARM, 11/26/16 AT 2058 BY J FUDESCO    Streptococcus pneumoniae NOT DETECTED NOT DETECTED Final   Streptococcus pyogenes NOT DETECTED NOT DETECTED Final   Acinetobacter baumannii NOT DETECTED NOT DETECTED  Final   Enterobacteriaceae species NOT DETECTED NOT DETECTED Final   Enterobacter cloacae complex NOT DETECTED NOT DETECTED Final   Escherichia coli NOT DETECTED NOT DETECTED Final   Klebsiella oxytoca NOT DETECTED NOT DETECTED Final   Klebsiella pneumoniae NOT DETECTED NOT DETECTED Final   Proteus species NOT DETECTED NOT DETECTED Final   Serratia marcescens NOT DETECTED NOT DETECTED Final   Haemophilus influenzae NOT DETECTED NOT DETECTED Final   Neisseria meningitidis NOT DETECTED NOT DETECTED Final   Pseudomonas aeruginosa NOT  DETECTED NOT DETECTED Final   Candida albicans NOT DETECTED NOT DETECTED Final   Candida glabrata NOT DETECTED NOT DETECTED Final   Candida krusei NOT DETECTED NOT DETECTED Final   Candida parapsilosis NOT DETECTED NOT DETECTED Final   Candida tropicalis NOT DETECTED NOT DETECTED Final  Culture, respiratory     Status: None   Collection Time: 11/26/16  9:24 AM  Result Value Ref Range Status   Specimen Description TRACHEAL ASPIRATE  Final   Special Requests NONE  Final   Gram Stain   Final    RARE WBC PRESENT, PREDOMINANTLY PMN RARE GRAM POSITIVE COCCI IN CLUSTERS RARE GRAM POSITIVE RODS    Culture   Final    ABUNDANT METHICILLIN RESISTANT STAPHYLOCOCCUS AUREUS   Report Status 11/28/2016 FINAL  Final   Organism ID, Bacteria METHICILLIN RESISTANT STAPHYLOCOCCUS AUREUS  Final      Susceptibility   Methicillin resistant staphylococcus aureus - MIC*    CIPROFLOXACIN >=8 RESISTANT Resistant     ERYTHROMYCIN >=8 RESISTANT Resistant     GENTAMICIN <=0.5 SENSITIVE Sensitive     OXACILLIN >=4 RESISTANT Resistant     TETRACYCLINE <=1 SENSITIVE Sensitive     VANCOMYCIN <=0.5 SENSITIVE Sensitive     TRIMETH/SULFA >=320 RESISTANT Resistant     CLINDAMYCIN <=0.25 SENSITIVE Sensitive     RIFAMPIN <=0.5 SENSITIVE Sensitive     Inducible Clindamycin NEGATIVE Sensitive     * ABUNDANT METHICILLIN RESISTANT STAPHYLOCOCCUS AUREUS  Urine culture     Status: Abnormal    Collection Time: 11/26/16 10:08 AM  Result Value Ref Range Status   Specimen Description URINE, RANDOM  Final   Special Requests NONE  Final   Culture <10,000 COLONIES/mL INSIGNIFICANT GROWTH (A)  Final   Report Status 11/27/2016 FINAL  Final  MRSA PCR Screening     Status: Abnormal   Collection Time: 11/26/16  7:33 PM  Result Value Ref Range Status   MRSA by PCR POSITIVE (A) NEGATIVE Final    Comment:        The GeneXpert MRSA Assay (FDA approved for NASAL specimens only), is one component of a comprehensive MRSA colonization surveillance program. It is not intended to diagnose MRSA infection nor to guide or monitor treatment for MRSA infections. RESULT CALLED TO, READ BACK BY AND VERIFIED WITH: [place] [id number] AT 2[facility]          Radiology Studies: Ct Head Wo Contrast  Result Date: 11/26/2016 CLINICAL DATA:  Altered mental status EXAM: CT HEAD WITHOUT CONTRAST TECHNIQUE: Contiguous axial images were obtained from the base of the skull through the vertex without intravenous contrast. COMPARISON:  None. FINDINGS: Brain: Large territory of left MCA infarction. Diffuse hypodensity throughout the left MCA territory including the left basal ganglia. This is most compatible with late subacute or chronic infarction. No prior studies available for comparison. Generalized atrophy. Chronic microvascular ischemic change in the white matter. Negative for hemorrhage. No shift of the midline structures. Vascular: Diffuse arterial calcification.  Negative for dense MCA. Skull: Negative Sinuses/Orbits: Mucosal edema in the paranasal sinuses. Calcification of the right globe due to chronic insult. Other: None IMPRESSION: Large territory left MCA infarction which appears chronic. Atrophy and chronic ischemia. No acute hemorrhage. Electronically Signed   By: Franchot Gallo M.D.   On: 11/26/2016 12:17        Scheduled Meds: . sodium chloride  10 mL/hr Intravenous Once  . amantadine   100 mg Oral Daily  . amLODipine  10 mg Oral Daily  .  aspirin  81 mg Oral Daily  . atorvastatin  40 mg Oral Daily  . chlorhexidine gluconate (MEDLINE KIT)  15 mL Mouth Rinse BID  . Chlorhexidine Gluconate Cloth  6 each Topical Q0600  . docusate  100 mg Oral BID  . famotidine  20 mg Oral Daily  . fentaNYL  12.5 mcg Transdermal Q72H  . ferrous sulfate  300 mg Oral BID WC  . free water  200 mL Per Tube Q4H  . heparin  5,000 Units Subcutaneous Q8H  . insulin aspart  0-24 Units Subcutaneous Q4H  . insulin aspart  10 Units Subcutaneous Once  . insulin aspart  4 Units Subcutaneous TID WC  . insulin detemir  10 Units Subcutaneous QHS  . levothyroxine  25 mcg Oral QAC breakfast  . mouth rinse  15 mL Mouth Rinse QID  . mupirocin ointment  1 application Nasal BID  . pencillin G potassium IV  2 Million Units Intravenous Q4H  . sertraline  100 mg Oral QHS   Continuous Infusions: . sodium chloride 75 mL/hr at 11/28/16 0507  . feeding supplement (GLUCERNA 1.2 CAL) 1,000 mL (11/27/16 1835)     LOS: 2 days    Cherylene Ferrufino Tanna Furry, MD [facility] Pager [phone]  If 7PM-7AM, please contact night-coverage [email or web address] Password [email or web address] 11/28/2016, 11:41 AM

## 2016-11-29 DIAGNOSIS — Z515 Encounter for palliative care: Secondary | ICD-10-CM

## 2016-11-29 DIAGNOSIS — E1151 Type 2 diabetes mellitus with diabetic peripheral angiopathy without gangrene: Secondary | ICD-10-CM

## 2016-11-29 LAB — BASIC METABOLIC PANEL
Anion gap: 8 (ref 5–15)
BUN: 65 mg/dL — AB (ref 6–20)
CHLORIDE: 111 mmol/L (ref 101–111)
CO2: 18 mmol/L — AB (ref 22–32)
Calcium: 8.4 mg/dL — ABNORMAL LOW (ref 8.9–10.3)
Creatinine, Ser: 1.49 mg/dL — ABNORMAL HIGH (ref 0.44–1.00)
GFR calc Af Amer: 36 mL/min — ABNORMAL LOW (ref >60)
GFR calc non Af Amer: 31 mL/min — ABNORMAL LOW (ref >60)
Glucose, Bld: 198 mg/dL — ABNORMAL HIGH (ref 65–99)
POTASSIUM: 5.4 mmol/L — AB (ref 3.5–5.1)
SODIUM: 137 mmol/L (ref 135–145)

## 2016-11-29 LAB — GLUCOSE, CAPILLARY
GLUCOSE-CAPILLARY: 137 mg/dL — AB (ref 65–99)
GLUCOSE-CAPILLARY: 143 mg/dL — AB (ref 65–99)
GLUCOSE-CAPILLARY: 168 mg/dL — AB (ref 65–99)
Glucose-Capillary: 100 mg/dL — ABNORMAL HIGH (ref 65–99)
Glucose-Capillary: 121 mg/dL — ABNORMAL HIGH (ref 65–99)
Glucose-Capillary: 188 mg/dL — ABNORMAL HIGH (ref 65–99)
Glucose-Capillary: 192 mg/dL — ABNORMAL HIGH (ref 65–99)
Glucose-Capillary: 234 mg/dL — ABNORMAL HIGH (ref 65–99)

## 2016-11-29 LAB — CBC
HEMATOCRIT: 27.7 % — AB (ref 36.0–46.0)
Hemoglobin: 8.7 g/dL — ABNORMAL LOW (ref 12.0–15.0)
MCH: 32.1 pg (ref 26.0–34.0)
MCHC: 31.4 g/dL (ref 30.0–36.0)
MCV: 102.2 fL — AB (ref 78.0–100.0)
Platelets: 188 10*3/uL (ref 150–400)
RBC: 2.71 MIL/uL — ABNORMAL LOW (ref 3.87–5.11)
RDW: 16.8 % — AB (ref 11.5–15.5)
WBC: 17.7 10*3/uL — AB (ref 4.0–10.5)

## 2016-11-29 LAB — LACTIC ACID, PLASMA: Lactic Acid, Venous: 2.3 mmol/L (ref 0.5–1.9)

## 2016-11-29 MED ORDER — SODIUM CHLORIDE 0.9 % IV BOLUS (SEPSIS)
250.0000 mL | Freq: Once | INTRAVENOUS | Status: AC
  Administered 2016-11-29: 250 mL via INTRAVENOUS

## 2016-11-29 MED ORDER — FREE WATER
200.0000 mL | Freq: Two times a day (BID) | Status: DC
  Administered 2016-11-29 – 2016-12-02 (×7): 200 mL

## 2016-11-29 MED ORDER — SODIUM CHLORIDE 0.9 % IV SOLN
INTRAVENOUS | Status: DC
  Administered 2016-11-29 – 2016-12-03 (×9): via INTRAVENOUS

## 2016-11-29 NOTE — Progress Notes (Signed)
Daily Progress Note   Patient Name: Evelyn [name]       Date: 11/29/2016 DOB: November 28, 1929  Age: 83 y.o. MRN#: [id number] Attending Physician: Rosita Fire, MD Primary Care Physician: Pcp Not In System Admit Date: 11/26/2016  Reason for Consultation/Follow-up: Establishing goals of care  Subjective: Patient in bed, non responsive, contracted.  I was able to reach Evelyn [name]- patient's other son. Explained patient's situation very directly. Patient is nonresponsive, nonverbal, nonambulatory, contracted at baseline. Recommended DNR status and Hospice care. Evelyn [name] did not have a clear picture of his Mom's status. He is going to discuss with his nephew ([name]) and call me back with decisions. He was much more receptive and open to concept of comfort and palliative care.   Review of Systems  Unable to perform ROS: Patient unresponsive    Length of Stay: 3  Current Medications: Scheduled Meds:  . sodium chloride  10 mL/hr Intravenous Once  . amantadine  100 mg Oral Daily  . aspirin  81 mg Oral Daily  . atorvastatin  40 mg Oral Daily  . chlorhexidine gluconate (MEDLINE KIT)  15 mL Mouth Rinse BID  . Chlorhexidine Gluconate Cloth  6 each Topical Q0600  . docusate  100 mg Oral BID  . famotidine  20 mg Oral Daily  . fentaNYL  12.5 mcg Transdermal Q72H  . ferrous sulfate  300 mg Oral BID WC  . free water  200 mL Per Tube Q12H  . heparin  5,000 Units Subcutaneous Q8H  . insulin aspart  0-24 Units Subcutaneous Q4H  . insulin aspart  10 Units Subcutaneous Once  . insulin aspart  4 Units Subcutaneous TID WC  . insulin detemir  10 Units Subcutaneous QHS  . levothyroxine  25 mcg Oral QAC breakfast  . mouth rinse  15 mL Mouth Rinse QID  . mupirocin ointment  1  application Nasal BID  . pencillin G potassium IV  2 Million Units Intravenous Q4H  . sertraline  100 mg Oral QHS    Continuous Infusions: . sodium chloride    . feeding supplement (GLUCERNA 1.2 CAL) 1,000 mL (11/28/16 1546)    PRN Meds: acetaminophen **OR** acetaminophen, guaifenesin, HYDROcodone-acetaminophen, ipratropium-albuterol  Physical Exam  Constitutional:  cachetic  Abdominal: Soft. Bowel sounds are normal.  Musculoskeletal:  Both hands contracted,  BLE rigid  Neurological:  nonresponsive  Skin: Skin is warm and dry.  Nursing note and vitals reviewed.           Vital Signs: BP 132/62   Pulse 82   Temp 99.1 F (37.3 C) (Axillary)   Resp (!) 24   Ht 5' (1.524 m)   Wt 45.4 kg (100 lb 1.4 oz)   SpO2 92%   BMI 19.55 kg/m  SpO2: SpO2: 92 % O2 Device: O2 Device: Tracheostomy Collar O2 Flow Rate: O2 Flow Rate (L/min): 8 L/min  Intake/output summary:  Intake/Output Summary (Last 24 hours) at 11/29/16 1043 Last data filed at 11/29/16 8786  Gross per 24 hour  Intake             3785 ml  Output                0 ml  Net             3785 ml   LBM: Last BM Date: 11/28/16 Baseline Weight: Weight: 44.7 kg (98 lb 8.7 oz) Most recent weight: Weight: 45.4 kg (100 lb 1.4 oz)       Palliative Assessment/Data: PPS: 10%   Flowsheet Rows   Flowsheet Row Most Recent Value  Intake Tab  Referral Department  Hospitalist  Unit at Time of Referral  Intermediate Care Unit  Palliative Care Primary Diagnosis  Pulmonary  Date Notified  11/27/16  Palliative Care Type  New Palliative care  Reason for referral  Clarify Goals of Care  Date of Admission  11/26/16  # of days IP prior to Palliative referral  1  Clinical Assessment  Psychosocial & Spiritual Assessment  Palliative Care Outcomes      Patient Active Problem List   Diagnosis Date Noted  . Palliative care by specialist   . Malnutrition of moderate degree 11/27/2016  . Sepsis due to Streptococcus agalactiae ([place])    . Tracheostomy status ([place])   . Dehydration   . Hyperglycemia 11/26/2016  . DM (diabetes mellitus) type II controlled peripheral vascular disorder ([place]) 11/26/2016  . Acute metabolic encephalopathy 76/72/0947  . Acute on chronic respiratory failure ([place]) 11/26/2016  . Acute respiratory disease   . Septic shock ([place])   . Acute on chronic respiratory failure with hypoxia ([place])   . Pressure injury of skin 10/23/2016  . Goals of care, counseling/discussion   . Palliative care encounter   . AKI (acute kidney injury) ([place]) 10/19/2016  . Hypertension 10/19/2016  . Chronic obstructive pulmonary disease ([place]) 10/19/2016  . Hyperlipidemia 10/19/2016  . Anemia 10/19/2016  . Hypernatremia 10/19/2016  . Hypokalemia 10/19/2016  . Chronic respiratory failure with hypoxia ([place]) 10/19/2016  . Status [name] tracheostomy ([place]) 10/19/2016  . Gastrostomy tube dependent ([place]) 10/19/2016    Palliative Care Assessment & Plan   Patient Profile:  83 y.o. female  with past medical history of HTN, CVA, COPD (chronic resp failure w/ hypoxia, trach), PEG with continuous feeding, anemia, admitted on 11/26/2016 with altered mental status and respiratory distress.  Workup reveals strep bacteremia, on abx, but overall clinical picture is poor.   Assessment/Recommendations/Plan   Recommendation made to family for DNR, Hospice  Evelyn [name] is too discuss with family and return call me with further decisions  PMT will continue [place] discussion with family  Goals of Care and Additional Recommendations:  Limitations on Scope of Treatment: Full Scope Treatment for now  Code Status:  Full code  Prognosis:   Unable to determine  Discharge Planning:  To Be Determined  Care plan was discussed with patient's attending physician, patient's sonLennox [name], and patient's RN.  Thank you for allowing the Palliative Medicine Team to assist in the care of this patient.   Total time: 50 minutes  Greater than 50%  of  this time was spent counseling and coordinating care related to the above assessment and plan.  Evelyn [name], AGNP-C Palliative Medicine   Please contact Palliative Medicine Team phone at [phone] for questions and concerns.

## 2016-11-29 NOTE — Progress Notes (Signed)
CRITICAL VALUE ALERT  Critical value received:  Lactic acid 2.3  Date of notification:  11/29/16   Time of notification:  0446  Critical value read back:Yes.    Nurse who received alert:  Della Gooummings, Mauro Arps R   MD notified (1st page):  [place]  Time of first page:  4:49 AM   MD notified (2nd page):  Time of second page:  Responding MD:    Time MD responded:

## 2016-11-29 NOTE — Progress Notes (Signed)
PROGRESS NOTE    Evelyn [name]  DJS:[id number] DOB: 06-06-1930 DOA: 11/26/2016 PCP: Pcp Not In System   Brief Narrative: 83 y.o. female with medical history significant of hypertension, CVA, COPD with chronic respiratory failure with hypoxia, status post try tracheostomy, status post gastrostomy, anemia, previous history of acute kidney injury and hypernatremia during the last hospitalization in December 2017 who presented to them emergency department with altered mental status and respiratory distress. She is  A resident of the SNF and at baseline is not verbal, but during the last 24 hours she became less responsive and her her breathing became more labored.  Assessment & Plan:   # Acute metabolic encephalopathy: Likely in the setting of UTI and bacteremia. CT scan of head with chronic stroke. No acute changes. -Patient is currently on trach, has PEG tube -Patient is nonverbal at baseline. I believe this is patient's baseline mental status.   #Goals of care: Patient with chronic comorbidities, nonverbal, has trach and PEG.She is very minimally responsive to painful stimuli. Continue to monitor. Palliative care following. Discussed with the palliative care team who has ongoing discussion with the family members. I believe patient will benefit from comfort measures and possibly hospice care at this time. -Patient with multiple hospitalization and very poor quality of life.  # Sepsis due to streptococcus bacteremia: Patient with elevated lactate level, leukocytosis on admission.  -Infectious disease consult appreciated,  -Currently on penicillin. As per infectious disease negative able to place PICC line if repeat cultures negative by 48 hours. Also may be able to switch to ceftriaxone once daily or oral amoxicillin for only suppressive treatment. For now I will continue penicillin pending final goals of care discussion.  -White blood cell count trending down to 17.7 - Follow up final  culture results. Blood cultures repeated on 1/30.  #Acute on chronic respiratory failure, hypoxemic: Patient was on ventilator on admission. Pulmonary critical care service evaluated the patient. Currently on trach collar with high flow oxygen. Respiratory therapist evaluation and treatment appreciated.  -Chest x-ray with no active disease. Continue to monitor. Patient has chronic trach.  # Hypernatremia on admission - recurrent problem, -Serum sodium level is improved. Changed to IV fluid to normal saline and reduced amount of free water through the tube. Continue to monitor.   # Uncontrolled DM type II with Hyperglycemia:  HgbA1C 7.9 -Continue current insulin regimen. Monitor blood sugar level. Avoid hypoglycemic episode.  # Acute on chronic kidney disease: Likely prerenal and UTI -Serum creatinine level is improving to 1.4 today. Monitor BMP.              # History of HTN: Blood pressure acceptable today. Amlodipine on hold.   # Hypothyroidism Continue synthroid  # Elevated troponin likely in the setting of sepsis and renal failure.  -Echocardiogram with EF of 55-60% and a grade 1 diastolic dysfunction..   Principal Problem:   Acute metabolic encephalopathy Active Problems:   AKI (acute kidney injury) ([place])   Hypertension   Chronic obstructive pulmonary disease (HCC)   Hyperlipidemia   Anemia   Hypernatremia   Gastrostomy tube dependent (HCC)   Hyperglycemia   DM (diabetes mellitus) type II controlled peripheral vascular disorder (HCC)   Acute on chronic respiratory failure (HCC)   Malnutrition of moderate degree   Sepsis due to Streptococcus agalactiae ([place])   Tracheostomy status ([place])   Dehydration   Palliative care by specialist  DVT prophylaxis: Heparin subcutaneous. Code Status: Full code Family Communication: No family present  at bedside Disposition Plan: Likely discharge to nursing home in  few days. Depending on clinical improvement and palliative care  consults evaluation.    Consultants:   palliative care and critical care.  Procedures: Mechanical ventilator on admission Antimicrobials:  Vancomycin and cefepime on 1/28.  Started penicillin since 1/29  Subjective: Patient was seen and examined at bedside. She is nonverbal and minimally responsive to painful stimuli. Review of systems Limited.   Objective: Vitals:   11/29/16 0457 11/29/16 0500 11/29/16 0800 11/29/16 0846  BP:  (!) 133/91 132/62 132/62  Pulse:  79  82  Resp:  (!) 28 (!) 26 (!) 24  Temp:   99.1 F (37.3 C)   TempSrc:   Axillary   SpO2:  100%  92%  Weight: 45.4 kg (100 lb 1.4 oz)     Height:        Intake/Output Summary (Last 24 hours) at 11/29/16 1114 Last data filed at 11/29/16 0504  Gross per 24 hour  Intake             3785 ml  Output                0 ml  Net             3785 ml   Filed Weights   11/27/16 1138 11/28/16 0400 11/29/16 0457  Weight: 41.3 kg (91 lb) 42 kg (92 lb 9.5 oz) 45.4 kg (100 lb 1.4 oz)    Examination:  General exam: Ill-looking female somnolent, lethargic, Very minimally responsive to painful stimuli, Unchanged from yesterday. Tracheostomy site clean, not on ventilator.  Respiratory system: Clear bilaterally, no wheezing or crackle.  Cardiovascular system: S1 & S2 heard, RRR.  No pedal edema. Gastrointestinal system: Abdomen is nondistended, soft. Normal bowel sounds heard. PEG site clean. Central nervous system: Lethargic Extremities: No edema Skin: No rashes, lesions or ulcers Psychiatry: Lethargic, not responsive.    Data Reviewed: I have personally reviewed following labs and imaging studies  CBC:  Recent Labs Lab 11/26/16 0639 11/26/16 0835 11/26/16 0847 11/26/16 1457 11/27/16 0222 11/28/16 0523 11/29/16 0326  WBC 9.1 20.4*  --  21.6* 8.7 26.5* 17.7*  NEUTROABS 7.8* 18.2*  --   --   --   --   --   HGB 5.3* 9.3* 9.5* 8.6* 8.5* 8.6* 8.7*  HCT 17.5* 31.5* 28.0* 28.4* 28.3* 28.4* 27.7*  MCV 109.4* 108.6*   --  106.0* 106.4* 105.6* 102.2*  PLT 140* 300  --  279 205 196 829   Basic Metabolic Panel:  Recent Labs Lab 11/27/16 0210 11/27/16 0222 11/27/16 0750 11/28/16 0523 11/29/16 0326  NA 154* 155* 150* 145 137  K 3.5 3.5 5.3* 5.2* 5.4*  CL 126* 129* 125* 119* 111  CO2 16* 18* 18* 19* 18*  GLUCOSE 165* 164* 401* 202* 198*  BUN 112* 109* 109* 85* 65*  CREATININE 2.09* 2.05* 2.06* 1.73* 1.49*  CALCIUM 8.9 8.7* 8.4* 8.8* 8.4*   GFR: Estimated Creatinine Clearance: 20.1 mL/min (by C-G formula based on SCr of 1.49 mg/dL (H)). Liver Function Tests:  Recent Labs Lab 11/26/16 0835 11/27/16 0222  AST 50* 40  ALT 48 40  ALKPHOS 279* 313*  BILITOT 0.7 0.3  PROT 7.4 6.3*  ALBUMIN 2.2* 1.9*   No results for input(s): LIPASE, AMYLASE in the last 168 hours. No results for input(s): AMMONIA in the last 168 hours. Coagulation Profile: No results for input(s): INR, PROTIME in the last 168 hours. Cardiac Enzymes:  Recent Labs Lab 11/26/16 0835 11/26/16 1457 11/26/16 2002 11/27/16 0210  TROPONINI 0.52* 0.37* 0.33* 0.30*   BNP (last 3 results) No results for input(s): PROBNP in the last 8760 hours. HbA1C:  Recent Labs  11/26/16 1457  HGBA1C 7.9*   CBG:  Recent Labs Lab 11/28/16 1610 11/28/16 2022 11/29/16 0022 11/29/16 0318 11/29/16 0740  GLUCAP 175* 164* 234* 188* 137*   Lipid Profile: No results for input(s): CHOL, HDL, LDLCALC, TRIG, CHOLHDL, LDLDIRECT in the last 72 hours. Thyroid Function Tests: No results for input(s): TSH, T4TOTAL, FREET4, T3FREE, THYROIDAB in the last 72 hours. Anemia Panel: No results for input(s): VITAMINB12, FOLATE, FERRITIN, TIBC, IRON, RETICCTPCT in the last 72 hours. Sepsis Labs:  Recent Labs Lab 11/27/16 0210 11/27/16 0750 11/27/16 1038 11/29/16 0317  LATICACIDVEN 2.5* 1.0 3.7* 2.3*    Recent Results (from the past 240 hour(s))  Blood Culture (routine x 2)     Status: Abnormal   Collection Time: 11/26/16  5:44 AM  Result  Value Ref Range Status   Specimen Description BLOOD LEFT HAND  Final   Special Requests IN PEDIATRIC BOTTLE 1CC  Final   Culture  Setup Time   Final    GRAM POSITIVE COCCI IN CLUSTERS IN PEDIATRIC BOTTLE CRITICAL RESULT CALLED TO, READ BACK BY AND VERIFIED WITH: RUTH CLARK,PHARMD AT 3474 11/27/16 G.MCADOO    Culture (A)  Final    GROUP B STREP(S.AGALACTIAE)ISOLATED SUSCEPTIBILITIES PERFORMED ON PREVIOUS CULTURE WITHIN THE LAST 5 DAYS.    Report Status 11/28/2016 FINAL  Final  Blood Culture (routine x 2)     Status: Abnormal   Collection Time: 11/26/16  6:00 AM  Result Value Ref Range Status   Specimen Description BLOOD RIGHT FOREARM  Final   Special Requests BOTTLES DRAWN AEROBIC AND ANAEROBIC 5CC  Final   Culture  Setup Time   Final    GRAM POSITIVE COCCI IN CHAINS IN BOTH AEROBIC AND ANAEROBIC BOTTLES CRITICAL RESULT CALLED TO, READ BACK BY AND VERIFIED WITH: A JOHNSTON,PHARMD AT 2058 11/26/16 BY J FUDESCO    Culture GROUP B STREP(S.AGALACTIAE)ISOLATED (A)  Final   Report Status 11/28/2016 FINAL  Final   Organism ID, Bacteria GROUP B STREP(S.AGALACTIAE)ISOLATED  Final      Susceptibility   Group b strep(s.agalactiae)isolated - MIC*    CLINDAMYCIN <=0.25 SENSITIVE Sensitive     AMPICILLIN <=0.25 SENSITIVE Sensitive     ERYTHROMYCIN <=0.12 SENSITIVE Sensitive     VANCOMYCIN 0.5 SENSITIVE Sensitive     CEFTRIAXONE <=0.12 SENSITIVE Sensitive     LEVOFLOXACIN 1 SENSITIVE Sensitive     * GROUP B STREP(S.AGALACTIAE)ISOLATED  Blood Culture ID Panel (Reflexed)     Status: Abnormal   Collection Time: 11/26/16  6:00 AM  Result Value Ref Range Status   Enterococcus species NOT DETECTED NOT DETECTED Final   Listeria monocytogenes NOT DETECTED NOT DETECTED Final   Staphylococcus species NOT DETECTED NOT DETECTED Final   Staphylococcus aureus NOT DETECTED NOT DETECTED Final   Streptococcus species DETECTED (A) NOT DETECTED Final    Comment: CRITICAL RESULT CALLED TO, READ BACK BY AND  VERIFIED WITH: A JOHNSTON, PHARM, 11/26/16 AT 2058 BY J FUDESCO    Streptococcus agalactiae DETECTED (A) NOT DETECTED Final    Comment: CRITICAL RESULT CALLED TO, READ BACK BY AND VERIFIED WITH: A JOHNSTON, PHARM, 11/26/16 AT 2058 BY J FUDESCO    Streptococcus pneumoniae NOT DETECTED NOT DETECTED Final   Streptococcus pyogenes NOT DETECTED NOT DETECTED Final   Acinetobacter baumannii NOT  DETECTED NOT DETECTED Final   Enterobacteriaceae species NOT DETECTED NOT DETECTED Final   Enterobacter cloacae complex NOT DETECTED NOT DETECTED Final   Escherichia coli NOT DETECTED NOT DETECTED Final   Klebsiella oxytoca NOT DETECTED NOT DETECTED Final   Klebsiella pneumoniae NOT DETECTED NOT DETECTED Final   Proteus species NOT DETECTED NOT DETECTED Final   Serratia marcescens NOT DETECTED NOT DETECTED Final   Haemophilus influenzae NOT DETECTED NOT DETECTED Final   Neisseria meningitidis NOT DETECTED NOT DETECTED Final   Pseudomonas aeruginosa NOT DETECTED NOT DETECTED Final   Candida albicans NOT DETECTED NOT DETECTED Final   Candida glabrata NOT DETECTED NOT DETECTED Final   Candida krusei NOT DETECTED NOT DETECTED Final   Candida parapsilosis NOT DETECTED NOT DETECTED Final   Candida tropicalis NOT DETECTED NOT DETECTED Final  Culture, respiratory     Status: None   Collection Time: 11/26/16  9:24 AM  Result Value Ref Range Status   Specimen Description TRACHEAL ASPIRATE  Final   Special Requests NONE  Final   Gram Stain   Final    RARE WBC PRESENT, PREDOMINANTLY PMN RARE GRAM POSITIVE COCCI IN CLUSTERS RARE GRAM POSITIVE RODS    Culture   Final    ABUNDANT METHICILLIN RESISTANT STAPHYLOCOCCUS AUREUS   Report Status 11/28/2016 FINAL  Final   Organism ID, Bacteria METHICILLIN RESISTANT STAPHYLOCOCCUS AUREUS  Final      Susceptibility   Methicillin resistant staphylococcus aureus - MIC*    CIPROFLOXACIN >=8 RESISTANT Resistant     ERYTHROMYCIN >=8 RESISTANT Resistant     GENTAMICIN  <=0.5 SENSITIVE Sensitive     OXACILLIN >=4 RESISTANT Resistant     TETRACYCLINE <=1 SENSITIVE Sensitive     VANCOMYCIN <=0.5 SENSITIVE Sensitive     TRIMETH/SULFA >=320 RESISTANT Resistant     CLINDAMYCIN <=0.25 SENSITIVE Sensitive     RIFAMPIN <=0.5 SENSITIVE Sensitive     Inducible Clindamycin NEGATIVE Sensitive     * ABUNDANT METHICILLIN RESISTANT STAPHYLOCOCCUS AUREUS  Urine culture     Status: Abnormal   Collection Time: 11/26/16 10:08 AM  Result Value Ref Range Status   Specimen Description URINE, RANDOM  Final   Special Requests NONE  Final   Culture <10,000 COLONIES/mL INSIGNIFICANT GROWTH (A)  Final   Report Status 11/27/2016 FINAL  Final  MRSA PCR Screening     Status: Abnormal   Collection Time: 11/26/16  7:33 PM  Result Value Ref Range Status   MRSA by PCR POSITIVE (A) NEGATIVE Final    Comment:        The GeneXpert MRSA Assay (FDA approved for NASAL specimens only), is one component of a comprehensive MRSA colonization surveillance program. It is not intended to diagnose MRSA infection nor to guide or monitor treatment for MRSA infections. RESULT CALLED TO, READ BACK BY AND VERIFIED WITHCandise Che RN [id number] AT 2302 SKEEN,P   Culture, blood (routine x 2)     Status: None (Preliminary result)   Collection Time: 11/28/16  5:15 AM  Result Value Ref Range Status   Specimen Description BLOOD LEFT HAND  Final   Special Requests IN PEDIATRIC BOTTLE 1ML  Final   Culture NO GROWTH < 12 HOURS  Final   Report Status PENDING  Incomplete  Culture, blood (routine x 2)     Status: None (Preliminary result)   Collection Time: 11/28/16  5:21 AM  Result Value Ref Range Status   Specimen Description BLOOD RIGHT HAND  Final   Special Requests IN PEDIATRIC  BOTTLE 2ML  Final   Culture NO GROWTH < 12 HOURS  Final   Report Status PENDING  Incomplete         Radiology Studies: No results found.      Scheduled Meds: . sodium chloride  10 mL/hr Intravenous Once  .  amantadine  100 mg Oral Daily  . aspirin  81 mg Oral Daily  . atorvastatin  40 mg Oral Daily  . chlorhexidine gluconate (MEDLINE KIT)  15 mL Mouth Rinse BID  . Chlorhexidine Gluconate Cloth  6 each Topical Q0600  . docusate  100 mg Oral BID  . famotidine  20 mg Oral Daily  . fentaNYL  12.5 mcg Transdermal Q72H  . ferrous sulfate  300 mg Oral BID WC  . free water  200 mL Per Tube Q12H  . heparin  5,000 Units Subcutaneous Q8H  . insulin aspart  0-24 Units Subcutaneous Q4H  . insulin aspart  10 Units Subcutaneous Once  . insulin aspart  4 Units Subcutaneous TID WC  . insulin detemir  10 Units Subcutaneous QHS  . levothyroxine  25 mcg Oral QAC breakfast  . mouth rinse  15 mL Mouth Rinse QID  . mupirocin ointment  1 application Nasal BID  . pencillin G potassium IV  2 Million Units Intravenous Q4H  . sertraline  100 mg Oral QHS   Continuous Infusions: . sodium chloride 100 mL/hr at 11/29/16 0800  . feeding supplement (GLUCERNA 1.2 CAL) 1,000 mL (11/28/16 1546)     LOS: 3 days    Dron Tanna Furry, MD [facility] Pager [phone]  If 7PM-7AM, please contact night-coverage [email or web address] Password [email or web address] 11/29/2016, 11:14 AM

## 2016-11-29 NOTE — Progress Notes (Signed)
[facility]   Reason for visit: Follow up on GBS bacteremia  Interval History: palliative care discussions ongoing, afebrile  Physical Exam: Constitutional:  Vitals:   11/29/16 0800 11/29/16 0846  BP: 132/62 132/62  Pulse:  82  Resp: (!) 26 (!) 24  Temp: 99.1 F (37.3 C)    patient appears in NAD but unresponsive Respiratory: Normal respiratory effort; CTA B, anterior exam Cardiovascular: RRR GI: soft, nt  Review of Systems: Unable to be assessed due to mental status  Lab Results  Component Value Date   WBC 17.7 (H) 11/29/2016   HGB 8.7 (L) 11/29/2016   HCT 27.7 (L) 11/29/2016   MCV 102.2 (H) 11/29/2016   PLT 188 11/29/2016    Lab Results  Component Value Date   CREATININE 1.49 (H) 11/29/2016   BUN 65 (H) 11/29/2016   NA 137 11/29/2016   K 5.4 (H) 11/29/2016   CL 111 11/29/2016   CO2 18 (L) 11/29/2016    Lab Results  Component Value Date   ALT 40 11/27/2016   AST 40 11/27/2016   ALKPHOS 313 (H) 11/27/2016     Microbiology: Recent Results (from the past 240 hour(s))  Blood Culture (routine x 2)     Status: Abnormal   Collection Time: 11/26/16  5:44 AM  Result Value Ref Range Status   Specimen Description BLOOD LEFT HAND  Final   Special Requests IN PEDIATRIC BOTTLE 1CC  Final   Culture  Setup Time   Final    GRAM POSITIVE COCCI IN CLUSTERS IN PEDIATRIC BOTTLE CRITICAL RESULT CALLED TO, READ BACK BY AND VERIFIED WITH: RUTH CLARK,PHARMD AT 9562 11/27/16 G.MCADOO    Culture (A)  Final    GROUP B STREP(S.AGALACTIAE)ISOLATED SUSCEPTIBILITIES PERFORMED ON PREVIOUS CULTURE WITHIN THE LAST 5 DAYS.    Report Status 11/28/2016 FINAL  Final  Blood Culture (routine x 2)     Status: Abnormal   Collection Time: 11/26/16  6:00 AM  Result Value Ref Range Status   Specimen Description BLOOD RIGHT FOREARM  Final   Special Requests BOTTLES DRAWN AEROBIC AND ANAEROBIC 5CC  Final   Culture  Setup Time   Final    GRAM POSITIVE COCCI IN  CHAINS IN BOTH AEROBIC AND ANAEROBIC BOTTLES CRITICAL RESULT CALLED TO, READ BACK BY AND VERIFIED WITH: A JOHNSTON,PHARMD AT 2058 11/26/16 BY J FUDESCO    Culture GROUP B STREP(S.AGALACTIAE)ISOLATED (A)  Final   Report Status 11/28/2016 FINAL  Final   Organism ID, Bacteria GROUP B STREP(S.AGALACTIAE)ISOLATED  Final      Susceptibility   Group b strep(s.agalactiae)isolated - MIC*    CLINDAMYCIN <=0.25 SENSITIVE Sensitive     AMPICILLIN <=0.25 SENSITIVE Sensitive     ERYTHROMYCIN <=0.12 SENSITIVE Sensitive     VANCOMYCIN 0.5 SENSITIVE Sensitive     CEFTRIAXONE <=0.12 SENSITIVE Sensitive     LEVOFLOXACIN 1 SENSITIVE Sensitive     * GROUP B STREP(S.AGALACTIAE)ISOLATED  Blood Culture ID Panel (Reflexed)     Status: Abnormal   Collection Time: 11/26/16  6:00 AM  Result Value Ref Range Status   Enterococcus species NOT DETECTED NOT DETECTED Final   Listeria monocytogenes NOT DETECTED NOT DETECTED Final   Staphylococcus species NOT DETECTED NOT DETECTED Final   Staphylococcus aureus NOT DETECTED NOT DETECTED Final   Streptococcus species DETECTED (A) NOT DETECTED Final    Comment: CRITICAL RESULT CALLED TO, READ BACK BY AND VERIFIED WITH: A JOHNSTON, PHARM, 11/26/16 AT 2058 BY J FUDESCO  Streptococcus agalactiae DETECTED (A) NOT DETECTED Final    Comment: CRITICAL RESULT CALLED TO, READ BACK BY AND VERIFIED WITH: A JOHNSTON, PHARM, 11/26/16 AT 2058 BY J FUDESCO    Streptococcus pneumoniae NOT DETECTED NOT DETECTED Final   Streptococcus pyogenes NOT DETECTED NOT DETECTED Final   Acinetobacter baumannii NOT DETECTED NOT DETECTED Final   Enterobacteriaceae species NOT DETECTED NOT DETECTED Final   Enterobacter cloacae complex NOT DETECTED NOT DETECTED Final   Escherichia coli NOT DETECTED NOT DETECTED Final   Klebsiella oxytoca NOT DETECTED NOT DETECTED Final   Klebsiella pneumoniae NOT DETECTED NOT DETECTED Final   Proteus species NOT DETECTED NOT DETECTED Final   Serratia marcescens NOT  DETECTED NOT DETECTED Final   Haemophilus influenzae NOT DETECTED NOT DETECTED Final   Neisseria meningitidis NOT DETECTED NOT DETECTED Final   Pseudomonas aeruginosa NOT DETECTED NOT DETECTED Final   Candida albicans NOT DETECTED NOT DETECTED Final   Candida glabrata NOT DETECTED NOT DETECTED Final   Candida krusei NOT DETECTED NOT DETECTED Final   Candida parapsilosis NOT DETECTED NOT DETECTED Final   Candida tropicalis NOT DETECTED NOT DETECTED Final  Culture, respiratory     Status: None   Collection Time: 11/26/16  9:24 AM  Result Value Ref Range Status   Specimen Description TRACHEAL ASPIRATE  Final   Special Requests NONE  Final   Gram Stain   Final    RARE WBC PRESENT, PREDOMINANTLY PMN RARE GRAM POSITIVE COCCI IN CLUSTERS RARE GRAM POSITIVE RODS    Culture   Final    ABUNDANT METHICILLIN RESISTANT STAPHYLOCOCCUS AUREUS   Report Status 11/28/2016 FINAL  Final   Organism ID, Bacteria METHICILLIN RESISTANT STAPHYLOCOCCUS AUREUS  Final      Susceptibility   Methicillin resistant staphylococcus aureus - MIC*    CIPROFLOXACIN >=8 RESISTANT Resistant     ERYTHROMYCIN >=8 RESISTANT Resistant     GENTAMICIN <=0.5 SENSITIVE Sensitive     OXACILLIN >=4 RESISTANT Resistant     TETRACYCLINE <=1 SENSITIVE Sensitive     VANCOMYCIN <=0.5 SENSITIVE Sensitive     TRIMETH/SULFA >=320 RESISTANT Resistant     CLINDAMYCIN <=0.25 SENSITIVE Sensitive     RIFAMPIN <=0.5 SENSITIVE Sensitive     Inducible Clindamycin NEGATIVE Sensitive     * ABUNDANT METHICILLIN RESISTANT STAPHYLOCOCCUS AUREUS  Urine culture     Status: Abnormal   Collection Time: 11/26/16 10:08 AM  Result Value Ref Range Status   Specimen Description URINE, RANDOM  Final   Special Requests NONE  Final   Culture <10,000 COLONIES/mL INSIGNIFICANT GROWTH (A)  Final   Report Status 11/27/2016 FINAL  Final  MRSA PCR Screening     Status: Abnormal   Collection Time: 11/26/16  7:33 PM  Result Value Ref Range Status   MRSA by  PCR POSITIVE (A) NEGATIVE Final    Comment:        The GeneXpert MRSA Assay (FDA approved for NASAL specimens only), is one component of a comprehensive MRSA colonization surveillance program. It is not intended to diagnose MRSA infection nor to guide or monitor treatment for MRSA infections. RESULT CALLED TO, READ BACK BY AND VERIFIED WITHFerman Hamming: KORSELIEN,A RN [id number] AT 2302 SKEEN,P   Culture, blood (routine x 2)     Status: None (Preliminary result)   Collection Time: 11/28/16  5:15 AM  Result Value Ref Range Status   Specimen Description BLOOD LEFT HAND  Final   Special Requests IN PEDIATRIC BOTTLE 1ML  Final   Culture NO GROWTH <  12 HOURS  Final   Report Status PENDING  Incomplete  Culture, blood (routine x 2)     Status: None (Preliminary result)   Collection Time: 11/28/16  5:21 AM  Result Value Ref Range Status   Specimen Description BLOOD RIGHT HAND  Final   Special Requests IN PEDIATRIC BOTTLE  Final   Culture NO GROWTH < 12 HOURS  Final   Report Status PENDING  Incomplete    Impression/Plan:  1. GBS bacteremia - on penicillin.  With bacteremia only needs about 2 weeks if endocarditis ruled out, but need TEE for that.   TTE without vegetation.  Could also do once daily ceftriaxone picc line ok once blood cultures from today negative at least 48 hours  If treatment is desired, 6 weeks IV is optimal as above, other option is oral amoxicillin 500 mg tid with no expectation it is curative but suppressive with a chance for relapse  2.  dispo - ongoing discussion with palliative care.    At this point, I will sign off but please call back if further ID involvement is needed.  thanks

## 2016-11-30 DIAGNOSIS — N179 Acute kidney failure, unspecified: Secondary | ICD-10-CM

## 2016-11-30 LAB — TYPE AND SCREEN
BLOOD PRODUCT EXPIRATION DATE: 201802262359
BLOOD PRODUCT EXPIRATION DATE: 201802262359
Blood Product Expiration Date: 201802262359
UNIT TYPE AND RH: 7300
UNIT TYPE AND RH: 7300
Unit Type and Rh: 7300

## 2016-11-30 LAB — BASIC METABOLIC PANEL
ANION GAP: 7 (ref 5–15)
BUN: 46 mg/dL — AB (ref 6–20)
CO2: 17 mmol/L — AB (ref 22–32)
Calcium: 8.2 mg/dL — ABNORMAL LOW (ref 8.9–10.3)
Chloride: 116 mmol/L — ABNORMAL HIGH (ref 101–111)
Creatinine, Ser: 1.28 mg/dL — ABNORMAL HIGH (ref 0.44–1.00)
GFR calc Af Amer: 43 mL/min — ABNORMAL LOW (ref >60)
GFR calc non Af Amer: 37 mL/min — ABNORMAL LOW (ref >60)
GLUCOSE: 133 mg/dL — AB (ref 65–99)
POTASSIUM: 5.3 mmol/L — AB (ref 3.5–5.1)
Sodium: 140 mmol/L (ref 135–145)

## 2016-11-30 LAB — GLUCOSE, CAPILLARY
GLUCOSE-CAPILLARY: 167 mg/dL — AB (ref 65–99)
Glucose-Capillary: 120 mg/dL — ABNORMAL HIGH (ref 65–99)
Glucose-Capillary: 126 mg/dL — ABNORMAL HIGH (ref 65–99)
Glucose-Capillary: 107 mg/dL — ABNORMAL HIGH (ref 65–99)
Glucose-Capillary: 125 mg/dL — ABNORMAL HIGH (ref 65–99)

## 2016-11-30 LAB — CBC
HCT: 27.6 % — ABNORMAL LOW (ref 36.0–46.0)
HEMOGLOBIN: 8.5 g/dL — AB (ref 12.0–15.0)
MCH: 32.1 pg (ref 26.0–34.0)
MCHC: 30.8 g/dL (ref 30.0–36.0)
MCV: 104.2 fL — ABNORMAL HIGH (ref 78.0–100.0)
Platelets: 183 10*3/uL (ref 150–400)
RBC: 2.65 MIL/uL — ABNORMAL LOW (ref 3.87–5.11)
RDW: 17.2 % — ABNORMAL HIGH (ref 11.5–15.5)
WBC: 19.7 10*3/uL — ABNORMAL HIGH (ref 4.0–10.5)

## 2016-11-30 LAB — LACTIC ACID, PLASMA: Lactic Acid, Venous: 2.6 mmol/L (ref 0.5–1.9)

## 2016-11-30 NOTE — Progress Notes (Signed)
Titrated to 6L 28%, sat 100%.

## 2016-11-30 NOTE — Progress Notes (Signed)
CSW continuing to follow for return to [facility] when stable  Burna SisJenna H. Willo Yoon, LCSW Clinical Social Worker [phone]

## 2016-11-30 NOTE — Progress Notes (Addendum)
Daily Progress Note   Patient Name: Evelyn [name]       Date: 11/30/2016 DOB: 14-Dec-1929  Age: 83 y.o. MRN#: [id number] Attending Physician: Rosita Fire, MD Primary Care Physician: Pcp Not In System Admit Date: 11/26/2016  Reason for Consultation/Follow-up: Establishing goals of care  Subjective: Spoke again with Shawna Clamp and Belia Heman. They continue to say the are discussing decisions with other family members. They have my contact information and will call me with further decisions. I again encouraged them to allow for DNR status and consider Hospice care.  Review of Systems  Unable to perform ROS: Patient unresponsive    Length of Stay: 4  Current Medications: Scheduled Meds:  . sodium chloride  10 mL/hr Intravenous Once  . amantadine  100 mg Oral Daily  . aspirin  81 mg Oral Daily  . atorvastatin  40 mg Oral Daily  . chlorhexidine gluconate (MEDLINE KIT)  15 mL Mouth Rinse BID  . Chlorhexidine Gluconate Cloth  6 each Topical Q0600  . docusate  100 mg Oral BID  . famotidine  20 mg Oral Daily  . fentaNYL  12.5 mcg Transdermal Q72H  . ferrous sulfate  300 mg Oral BID WC  . free water  200 mL Per Tube Q12H  . heparin  5,000 Units Subcutaneous Q8H  . insulin aspart  0-24 Units Subcutaneous Q4H  . insulin aspart  10 Units Subcutaneous Once  . insulin aspart  4 Units Subcutaneous TID WC  . insulin detemir  10 Units Subcutaneous QHS  . levothyroxine  25 mcg Oral QAC breakfast  . mouth rinse  15 mL Mouth Rinse QID  . mupirocin ointment  1 application Nasal BID  . pencillin G potassium IV  2 Million Units Intravenous Q4H  . sertraline  100 mg Oral QHS    Continuous Infusions: . sodium chloride 100 mL/hr at 11/30/16 1214  . feeding supplement (GLUCERNA 1.2  CAL) 1,000 mL (11/30/16 1212)    PRN Meds: acetaminophen **OR** acetaminophen, guaifenesin, HYDROcodone-acetaminophen, ipratropium-albuterol  Physical Exam  Constitutional:  cachetic  Abdominal: Soft. Bowel sounds are normal.  Musculoskeletal:  Both hands contracted, BLE rigid  Neurological:  nonresponsive  Skin: Skin is warm and dry.  Nursing note and vitals reviewed.           Vital Signs:  BP 139/61 (BP Location: Left Arm)   Pulse 81   Temp (!) 100.5 F (38.1 C) (Axillary)   Resp 19   Ht 5' (1.524 m)   Wt 47.5 kg (104 lb 11.2 oz)   SpO2 100%   BMI 20.45 kg/m  SpO2: SpO2: 100 % O2 Device: O2 Device: Tracheostomy Collar O2 Flow Rate: O2 Flow Rate (L/min): (S) 6 L/min  Intake/output summary:   Intake/Output Summary (Last 24 hours) at 11/30/16 1523 Last data filed at 11/30/16 0900  Gross per 24 hour  Intake              360 ml  Output                0 ml  Net              360 ml   LBM: Last BM Date: 11/29/16 Baseline Weight: Weight: 44.7 kg (98 lb 8.7 oz) Most recent weight: Weight: 47.5 kg (104 lb 11.2 oz)       Palliative Assessment/Data: PPS: 10%   Flowsheet Rows   Flowsheet Row Most Recent Value  Intake Tab  Referral Department  Hospitalist  Unit at Time of Referral  Intermediate Care Unit  Palliative Care Primary Diagnosis  Pulmonary  Date Notified  11/27/16  Palliative Care Type  New Palliative care  Reason for referral  Clarify Goals of Care  Date of Admission  11/26/16  # of days IP prior to Palliative referral  1  Clinical Assessment  Psychosocial & Spiritual Assessment  Palliative Care Outcomes      Patient Active Problem List   Diagnosis Date Noted  . Palliative care by specialist   . Malnutrition of moderate degree 11/27/2016  . Sepsis due to Streptococcus agalactiae ([place])   . Tracheostomy status ([place])   . Dehydration   . Hyperglycemia 11/26/2016  . DM (diabetes mellitus) type II controlled peripheral vascular disorder ([place])  11/26/2016  . Acute metabolic encephalopathy 26/71/2458  . Acute on chronic respiratory failure ([place]) 11/26/2016  . Acute respiratory disease   . Septic shock ([place])   . Acute on chronic respiratory failure with hypoxia ([place])   . Pressure injury of skin 10/23/2016  . Goals of care, counseling/discussion   . Palliative care encounter   . AKI (acute kidney injury) ([place]) 10/19/2016  . Hypertension 10/19/2016  . Chronic obstructive pulmonary disease ([place]) 10/19/2016  . Hyperlipidemia 10/19/2016  . Anemia 10/19/2016  . Hypernatremia 10/19/2016  . Hypokalemia 10/19/2016  . Chronic respiratory failure with hypoxia ([place]) 10/19/2016  . Status post tracheostomy ([place]) 10/19/2016  . Gastrostomy tube dependent ([place]) 10/19/2016    Palliative Care Assessment & Plan   Patient Profile:  83 y.o. female  with past medical history of HTN, CVA, COPD (chronic resp failure w/ hypoxia, trach), PEG with continuous feeding, anemia, admitted on 11/26/2016 with altered mental status and respiratory distress.  Workup reveals strep bacteremia, on abx, but overall clinical picture is poor.   Assessment/Recommendations/Plan   Recommendation made to family for DNR, Hospice  Awaiting family discussion and decisions, supporting them through this process, communication is complicated by one brother living in [place]  PMT will continue [place] discussion with family while patient is here  Goals of Care and Additional Recommendations:  Limitations on Scope of Treatment: Full Scope Treatment for now  Code Status:  Full code  Prognosis:   Unable to determine  Discharge Planning:  To Be Determined  Care plan was discussed with patient's attending physician,  patient's son- [name], and grandsonDarnelle Maffucci.  Thank you for allowing the Palliative Medicine Team to assist in the care of this patient.   Total time: 25 minutes  Greater than 50%  of this time was spent counseling and coordinating care related to  the above assessment and plan.  Mariana Kaufman, AGNP-C Palliative Medicine   Please contact Palliative Medicine Team phone at [phone] for questions and concerns.

## 2016-11-30 NOTE — Progress Notes (Signed)
PROGRESS NOTE    Evelyn [name]  TBB:[id number] DOB: [birth year] DOA: 11/26/2016 PCP: Pcp Not In System   Brief Narrative: 83 y.o. female with medical history significant of hypertension, CVA, COPD with chronic respiratory failure with hypoxia, status post try tracheostomy, status post gastrostomy, anemia, previous history of acute kidney injury and hypernatremia during the last hospitalization in December 2017 who presented to them emergency department with altered mental status and respiratory distress. She is  A resident of the SNF and at baseline is not verbal, but during the last 24 hours she became less responsive and her her breathing became more labored.  Assessment & Plan:   # Acute metabolic encephalopathy: Likely in the setting of UTI and bacteremia. CT scan of head with chronic stroke. No acute changes. -Patient is currently on trach, has PEG tube -Patient is nonverbal at baseline. I believe this is patient's baseline mental status.   #Goals of care: Patient with chronic comorbidities, nonverbal, has trach and PEG.She is very minimally responsive to painful stimuli. Continue to monitor. Palliative care following. Discussed with the palliative care team who has ongoing discussion with the family members. I believe patient will benefit from comfort measures and possibly hospice care at this time. -Patient with multiple hospitalization and very poor quality of life. -We will follow-up regarding the plan.  # Sepsis due to group B streptococcus bacteremia: Patient with elevated lactate level, leukocytosis on admission.  -Infectious disease consult appreciated, recommended penicillin vs ceftriaxone daily for 6 weeks. Pt has persistent elevated lactate level, leukocytosis therefore I called the infectious disease today. MRSA PCR positive could be patient is a carrier. I discussed with Dr. Nada Maclachlan to reevaluate the patient. -For now I will continue penicillin pending goals of care discussion  from palliative care service. WBC 19.7 today. - Follow up final culture results. Blood cultures repeated on 1/30.  #Acute on chronic respiratory failure, hypoxemic: Patient was on ventilator on admission. Pulmonary critical care service evaluated the patient. Currently on trach collar with high flow oxygen. Respiratory therapist evaluation and treatment appreciated.  -Chest x-ray with no active disease. Continue to monitor. Patient has chronic trach.  # Hypernatremia on admission - recurrent problem, -Serum sodium level is improved. Continue IV fluid and current management.  # Uncontrolled DM type II with Hyperglycemia:  HgbA1C 7.9 -Continue current insulin regimen. Monitor blood sugar level. Avoid hypoglycemic episode.  # Acute on chronic kidney disease: Likely prerenal and UTI -Serum creatinine level is improving to 1.2 today. Monitor BMP. Closely monitor serum potassium level.              # History of HTN: Blood pressure acceptable today. Amlodipine on hold.   # Hypothyroidism Continue synthroid  # Elevated troponin likely in the setting of sepsis and renal failure.  -Echocardiogram with EF of 55-60% and a grade 1 diastolic dysfunction..   Principal Problem:   Acute metabolic encephalopathy Active Problems:   AKI (acute kidney injury) ([facility])   Hypertension   Chronic obstructive pulmonary disease ([facility])   Hyperlipidemia   Anemia   Hypernatremia   Gastrostomy tube dependent ([facility])   Hyperglycemia   DM (diabetes mellitus) type II controlled peripheral vascular disorder ([facility])   Acute on chronic respiratory failure (H[facility])   Malnutrition of moderate degree   Sepsis due to Streptococcus agalactiae ([facility])   Tracheostomy status ([facility])   Dehydration   Palliative care by specialist  DVT prophylaxis: Changed to Lovenox subcutaneous Code Status: Full code Family Communication: No family present  at bedside Disposition Plan: Likely discharge to nursing home in  few days. Depending on  clinical improvement and palliative care consults evaluation.    Consultants:   palliative care and critical care.  Procedures: Mechanical ventilator on admission Antimicrobials:  Vancomycin and cefepime on 1/28.  Started penicillin since 1/29  Subjective: Patient was seen and examined at bedside. Patient continues to be nonverbal, no new event. Review of systems Limited.  Objective: Vitals:   11/30/16 0331 11/30/16 0400 11/30/16 0754 11/30/16 0900  BP:  139/62 (!) 132/57   Pulse: 78 74 81 81  Resp: (!) 22 18 (!) 21 19  Temp:  97.6 F (36.4 C) 100.2 F (37.9 C)   TempSrc:  Axillary Axillary   SpO2: 100% 100% 100% 100%  Weight:  47.5 kg (104 lb 11.2 oz)    Height:        Intake/Output Summary (Last 24 hours) at 11/30/16 1142 Last data filed at 11/30/16 0900  Gross per 24 hour  Intake           964.17 ml  Output                0 ml  Net           964.17 ml   Filed Weights   11/28/16 0400 11/29/16 0457 11/30/16 0400  Weight: 42 kg (92 lb 9.5 oz) 45.4 kg (100 lb 1.4 oz) 47.5 kg (104 lb 11.2 oz)    Examination:  General exam: Ill-looking female somnolent, unresponsive.  Tracheostomy site clean, not on ventilator.  Respiratory system: Clear bilaterally, no wheezing or crackle. Unchanged Cardiovascular system: S1 & S2 heard, RRR.  No pedal edema. Gastrointestinal system: Abdomen is nondistended, soft. Normal bowel sounds heard. PEG site clean. Central nervous system: Lethargic Extremities: No edema Skin: No rashes, lesions or ulcers Psychiatry: Lethargic, not responsive.    Data Reviewed: I have personally reviewed following labs and imaging studies  CBC:  Recent Labs Lab 11/26/16 0639 11/26/16 0835  11/26/16 1457 11/27/16 0222 11/28/16 0523 11/29/16 0326 11/30/16 0254  WBC 9.1 20.4*  --  21.6* 8.7 26.5* 17.7* 19.7*  NEUTROABS 7.8* 18.2*  --   --   --   --   --   --   HGB 5.3* 9.3*  < > 8.6* 8.5* 8.6* 8.7* 8.5*  HCT 17.5* 31.5*  < > 28.4* 28.3* 28.4*  27.7* 27.6*  MCV 109.4* 108.6*  --  106.0* 106.4* 105.6* 102.2* 104.2*  PLT 140* 300  --  279 205 196 188 183  < > = values in this interval not displayed. Basic Metabolic Panel:  Recent Labs Lab 11/27/16 0222 11/27/16 0750 11/28/16 0523 11/29/16 0326 11/30/16 0254  NA 155* 150* 145 137 140  K 3.5 5.3* 5.2* 5.4* 5.3*  CL 129* 125* 119* 111 116*  CO2 18* 18* 19* 18* 17*  GLUCOSE 164* 401* 202* 198* 133*  BUN 109* 109* 85* 65* 46*  CREATININE 2.05* 2.06* 1.73* 1.49* 1.28*  CALCIUM 8.7* 8.4* 8.8* 8.4* 8.2*   GFR: Estimated Creatinine Clearance: 23.5 mL/min (by C-G formula based on SCr of 1.28 mg/dL (H)). Liver Function Tests:  Recent Labs Lab 11/26/16 0835 11/27/16 0222  AST 50* 40  ALT 48 40  ALKPHOS 279* 313*  BILITOT 0.7 0.3  PROT 7.4 6.3*  ALBUMIN 2.2* 1.9*   No results for input(s): LIPASE, AMYLASE in the last 168 hours. No results for input(s): AMMONIA in the last 168 hours. Coagulation Profile: No results for  input(s): INR, PROTIME in the last 168 hours. Cardiac Enzymes:  Recent Labs Lab 11/26/16 0835 11/26/16 1457 11/26/16 2002 11/27/16 0210  TROPONINI 0.52* 0.37* 0.33* 0.30*   BNP (last 3 results) No results for input(s): PROBNP in the last 8760 hours. HbA1C: No results for input(s): HGBA1C in the last 72 hours. CBG:  Recent Labs Lab 11/29/16 1617 11/29/16 2008 11/30/16 0001 11/30/16 0408 11/30/16 0752  GLUCAP 192* 143* 168* 120* 126*   Lipid Profile: No results for input(s): CHOL, HDL, LDLCALC, TRIG, CHOLHDL, LDLDIRECT in the last 72 hours. Thyroid Function Tests: No results for input(s): TSH, T4TOTAL, FREET4, T3FREE, THYROIDAB in the last 72 hours. Anemia Panel: No results for input(s): VITAMINB12, FOLATE, FERRITIN, TIBC, IRON, RETICCTPCT in the last 72 hours. Sepsis Labs:  Recent Labs Lab 11/27/16 0750 11/27/16 1038 11/29/16 0317 11/30/16 0809  LATICACIDVEN 1.0 3.7* 2.3* 2.6*    Recent Results (from the past 240 hour(s))    Blood Culture (routine x 2)     Status: Abnormal   Collection Time: 11/26/16  5:44 AM  Result Value Ref Range Status   Specimen Description BLOOD LEFT HAND  Final   Special Requests IN PEDIATRIC BOTTLE 1CC  Final   Culture  Setup Time   Final    GRAM POSITIVE COCCI IN CLUSTERS IN PEDIATRIC BOTTLE CRITICAL RESULT CALLED TO, READ BACK BY AND VERIFIED WITH: RUTH CLARK,PHARMD AT 2751 11/27/16 G.MCADOO    Culture (A)  Final    GROUP B STREP(S.AGALACTIAE)ISOLATED SUSCEPTIBILITIES PERFORMED ON PREVIOUS CULTURE WITHIN THE LAST 5 DAYS.    Report Status 11/28/2016 FINAL  Final  Blood Culture (routine x 2)     Status: Abnormal   Collection Time: 11/26/16  6:00 AM  Result Value Ref Range Status   Specimen Description BLOOD RIGHT FOREARM  Final   Special Requests BOTTLES DRAWN AEROBIC AND ANAEROBIC 5CC  Final   Culture  Setup Time   Final    GRAM POSITIVE COCCI IN CHAINS IN BOTH AEROBIC AND ANAEROBIC BOTTLES CRITICAL RESULT CALLED TO, READ BACK BY AND VERIFIED WITH: A JOHNSTON,PHARMD AT 2058 11/26/16 BY J FUDESCO    Culture GROUP B STREP(S.AGALACTIAE)ISOLATED (A)  Final   Report Status 11/28/2016 FINAL  Final   Organism ID, Bacteria GROUP B STREP(S.AGALACTIAE)ISOLATED  Final      Susceptibility   Group b strep(s.agalactiae)isolated - MIC*    CLINDAMYCIN <=0.25 SENSITIVE Sensitive     AMPICILLIN <=0.25 SENSITIVE Sensitive     ERYTHROMYCIN <=0.12 SENSITIVE Sensitive     VANCOMYCIN 0.5 SENSITIVE Sensitive     CEFTRIAXONE <=0.12 SENSITIVE Sensitive     LEVOFLOXACIN 1 SENSITIVE Sensitive     * GROUP B STREP(S.AGALACTIAE)ISOLATED  Blood Culture ID Panel (Reflexed)     Status: Abnormal   Collection Time: 11/26/16  6:00 AM  Result Value Ref Range Status   Enterococcus species NOT DETECTED NOT DETECTED Final   Listeria monocytogenes NOT DETECTED NOT DETECTED Final   Staphylococcus species NOT DETECTED NOT DETECTED Final   Staphylococcus aureus NOT DETECTED NOT DETECTED Final   Streptococcus  species DETECTED (A) NOT DETECTED Final    Comment: CRITICAL RESULT CALLED TO, READ BACK BY AND VERIFIED WITH: A JOHNSTON, PHARM, 11/26/16 AT 2058 BY J FUDESCO    Streptococcus agalactiae DETECTED (A) NOT DETECTED Final    Comment: CRITICAL RESULT CALLED TO, READ BACK BY AND VERIFIED WITH: A JOHNSTON, PHARM, 11/26/16 AT 2058 BY J FUDESCO    Streptococcus pneumoniae NOT DETECTED NOT DETECTED Final   Streptococcus  pyogenes NOT DETECTED NOT DETECTED Final   Acinetobacter baumannii NOT DETECTED NOT DETECTED Final   Enterobacteriaceae species NOT DETECTED NOT DETECTED Final   Enterobacter cloacae complex NOT DETECTED NOT DETECTED Final   Escherichia coli NOT DETECTED NOT DETECTED Final   Klebsiella oxytoca NOT DETECTED NOT DETECTED Final   Klebsiella pneumoniae NOT DETECTED NOT DETECTED Final   Proteus species NOT DETECTED NOT DETECTED Final   Serratia marcescens NOT DETECTED NOT DETECTED Final   Haemophilus influenzae NOT DETECTED NOT DETECTED Final   Neisseria meningitidis NOT DETECTED NOT DETECTED Final   Pseudomonas aeruginosa NOT DETECTED NOT DETECTED Final   Candida albicans NOT DETECTED NOT DETECTED Final   Candida glabrata NOT DETECTED NOT DETECTED Final   Candida krusei NOT DETECTED NOT DETECTED Final   Candida parapsilosis NOT DETECTED NOT DETECTED Final   Candida tropicalis NOT DETECTED NOT DETECTED Final  Culture, respiratory     Status: None   Collection Time: 11/26/16  9:24 AM  Result Value Ref Range Status   Specimen Description TRACHEAL ASPIRATE  Final   Special Requests NONE  Final   Gram Stain   Final    RARE WBC PRESENT, PREDOMINANTLY PMN RARE GRAM POSITIVE COCCI IN CLUSTERS RARE GRAM POSITIVE RODS    Culture   Final    ABUNDANT METHICILLIN RESISTANT STAPHYLOCOCCUS AUREUS   Report Status 11/28/2016 FINAL  Final   Organism ID, Bacteria METHICILLIN RESISTANT STAPHYLOCOCCUS AUREUS  Final      Susceptibility   Methicillin resistant staphylococcus aureus - MIC*     CIPROFLOXACIN >=8 RESISTANT Resistant     ERYTHROMYCIN >=8 RESISTANT Resistant     GENTAMICIN <=0.5 SENSITIVE Sensitive     OXACILLIN >=4 RESISTANT Resistant     TETRACYCLINE <=1 SENSITIVE Sensitive     VANCOMYCIN <=0.5 SENSITIVE Sensitive     TRIMETH/SULFA >=320 RESISTANT Resistant     CLINDAMYCIN <=0.25 SENSITIVE Sensitive     RIFAMPIN <=0.5 SENSITIVE Sensitive     Inducible Clindamycin NEGATIVE Sensitive     * ABUNDANT METHICILLIN RESISTANT STAPHYLOCOCCUS AUREUS  Urine culture     Status: Abnormal   Collection Time: 11/26/16 10:08 AM  Result Value Ref Range Status   Specimen Description URINE, RANDOM  Final   Special Requests NONE  Final   Culture <10,000 COLONIES/mL INSIGNIFICANT GROWTH (A)  Final   Report Status 11/27/2016 FINAL  Final  MRSA PCR Screening     Status: Abnormal   Collection Time: 11/26/16  7:33 PM  Result Value Ref Range Status   MRSA by PCR POSITIVE (A) NEGATIVE Final    Comment:        The GeneXpert MRSA Assay (FDA approved for NASAL specimens only), is one component of a comprehensive MRSA colonization surveillance program. It is not intended to diagnose MRSA infection nor to guide or monitor treatment for MRSA infections. RESULT CALLED TO, READ BACK BY AND VERIFIED WITHCandise Che RN [id number] AT 2302 SKEEN,P   Culture, blood (routine x 2)     Status: None (Preliminary result)   Collection Time: 11/28/16  5:15 AM  Result Value Ref Range Status   Specimen Description BLOOD LEFT HAND  Final   Special Requests IN PEDIATRIC BOTTLE 1ML  Final   Culture NO GROWTH 1 DAY  Final   Report Status PENDING  Incomplete  Culture, blood (routine x 2)     Status: None (Preliminary result)   Collection Time: 11/28/16  5:21 AM  Result Value Ref Range Status   Specimen Description BLOOD  RIGHT HAND  Final   Special Requests IN PEDIATRIC BOTTLE 2ML  Final   Culture NO GROWTH 1 DAY  Final   Report Status PENDING  Incomplete         Radiology Studies: No results  found.      Scheduled Meds: . sodium chloride  10 mL/hr Intravenous Once  . amantadine  100 mg Oral Daily  . aspirin  81 mg Oral Daily  . atorvastatin  40 mg Oral Daily  . chlorhexidine gluconate (MEDLINE KIT)  15 mL Mouth Rinse BID  . Chlorhexidine Gluconate Cloth  6 each Topical Q0600  . docusate  100 mg Oral BID  . famotidine  20 mg Oral Daily  . fentaNYL  12.5 mcg Transdermal Q72H  . ferrous sulfate  300 mg Oral BID WC  . free water  200 mL Per Tube Q12H  . heparin  5,000 Units Subcutaneous Q8H  . insulin aspart  0-24 Units Subcutaneous Q4H  . insulin aspart  10 Units Subcutaneous Once  . insulin aspart  4 Units Subcutaneous TID WC  . insulin detemir  10 Units Subcutaneous QHS  . levothyroxine  25 mcg Oral QAC breakfast  . mouth rinse  15 mL Mouth Rinse QID  . mupirocin ointment  1 application Nasal BID  . pencillin G potassium IV  2 Million Units Intravenous Q4H  . sertraline  100 mg Oral QHS   Continuous Infusions: . sodium chloride 100 mL/hr at 11/29/16 2351  . feeding supplement (GLUCERNA 1.2 CAL) 1,000 mL (11/29/16 1256)     LOS: 4 days    Kasen Adduci Tanna Furry, MD [facility] Pager [phone]  If 7PM-7AM, please contact night-coverage [email or web address] Password [email or web address] 11/30/2016, 11:42 AM

## 2016-12-01 LAB — BASIC METABOLIC PANEL
Anion gap: 7 (ref 5–15)
BUN: 31 mg/dL — AB (ref 6–20)
CALCIUM: 8.4 mg/dL — AB (ref 8.9–10.3)
CO2: 18 mmol/L — ABNORMAL LOW (ref 22–32)
CREATININE: 1.1 mg/dL — AB (ref 0.44–1.00)
Chloride: 113 mmol/L — ABNORMAL HIGH (ref 101–111)
GFR calc Af Amer: 52 mL/min — ABNORMAL LOW (ref >60)
GFR, EST NON AFRICAN AMERICAN: 45 mL/min — AB (ref >60)
GLUCOSE: 120 mg/dL — AB (ref 65–99)
Potassium: 5 mmol/L (ref 3.5–5.1)
Sodium: 138 mmol/L (ref 135–145)

## 2016-12-01 LAB — GLUCOSE, CAPILLARY
GLUCOSE-CAPILLARY: 121 mg/dL — AB (ref 65–99)
GLUCOSE-CAPILLARY: 150 mg/dL — AB (ref 65–99)
GLUCOSE-CAPILLARY: 168 mg/dL — AB (ref 65–99)
GLUCOSE-CAPILLARY: 88 mg/dL (ref 65–99)
Glucose-Capillary: 69 mg/dL (ref 65–99)
Glucose-Capillary: 106 mg/dL — ABNORMAL HIGH (ref 65–99)
Glucose-Capillary: 107 mg/dL — ABNORMAL HIGH (ref 65–99)
Glucose-Capillary: 108 mg/dL — ABNORMAL HIGH (ref 65–99)
Glucose-Capillary: 167 mg/dL — ABNORMAL HIGH (ref 65–99)

## 2016-12-01 LAB — CBC
HEMATOCRIT: 27 % — AB (ref 36.0–46.0)
Hemoglobin: 8.3 g/dL — ABNORMAL LOW (ref 12.0–15.0)
MCH: 31.8 pg (ref 26.0–34.0)
MCHC: 30.7 g/dL (ref 30.0–36.0)
MCV: 103.4 fL — AB (ref 78.0–100.0)
Platelets: 204 10*3/uL (ref 150–400)
RBC: 2.61 MIL/uL — ABNORMAL LOW (ref 3.87–5.11)
RDW: 16.5 % — ABNORMAL HIGH (ref 11.5–15.5)
WBC: 19.6 10*3/uL — ABNORMAL HIGH (ref 4.0–10.5)

## 2016-12-01 MED ORDER — DEXTROSE 50 % IV SOLN
INTRAVENOUS | Status: AC
  Administered 2016-12-01: 50 mL
  Filled 2016-12-01: qty 50

## 2016-12-01 MED ORDER — ACETYLCYSTEINE 20 % IN SOLN: 2.0000 mL | Freq: Three times a day (TID) | RESPIRATORY_TRACT | Status: DC

## 2016-12-01 MED ORDER — ACETYLCYSTEINE 20 % IN SOLN
3.0000 mL | Freq: Two times a day (BID) | RESPIRATORY_TRACT | Status: AC
  Administered 2016-12-01 – 2016-12-03 (×6): 3 mL via RESPIRATORY_TRACT
  Filled 2016-12-01 (×6): qty 4

## 2016-12-01 MED ORDER — IPRATROPIUM-ALBUTEROL 0.5-2.5 (3) MG/3ML IN SOLN
3.0000 mL | RESPIRATORY_TRACT | Status: DC
  Administered 2016-12-01 – 2016-12-03 (×10): 3 mL via RESPIRATORY_TRACT
  Filled 2016-12-01 (×11): qty 3

## 2016-12-01 NOTE — Progress Notes (Signed)
No family at bedside at time of my visit.  Patient remains unresponsive to verbal and tactile stimulation.  Left VM for sons requesting call back.  Will f/u again tomorrow.  NO CHARGE NOTE  [name], MD [facility] Pall[facility] Team [phone]

## 2016-12-01 NOTE — Progress Notes (Signed)
PROGRESS NOTE    Evelyn [name]  GUY:[id number] DOB: [birth year] DOA: 11/26/2016 PCP: Pcp Not In System   Brief Narrative: 83 y.o. female with medical history significant of hypertension, CVA, COPD with chronic respiratory failure with hypoxia, status post try tracheostomy, status post gastrostomy, anemia, previous history of acute kidney injury and hypernatremia during the last hospitalization in December 2017 who presented to them emergency department with altered mental status and respiratory distress. She is  A resident of the SNF and at baseline is not verbal, but during the last 24 hours she became less responsive and her her breathing became more labored.  Assessment & Plan:   # Chronic encephalopathy: CT scan of head with chronic stroke. No acute changes. Patient has trach, PEG tube for feeding and she is not verbal at baseline. I think this is her baseline. It was presumed acute metabolic encephalopathy on admission.  #Goals of care: Patient with chronic comorbidities, nonverbal, has trach and PEG.She is very minimally responsive to painful stimuli. Continue to monitor. Palliative care following. Discussed with the palliative care team who has ongoing discussion with the family members. I believe patient will benefit from comfort measures and possibly hospice care at this time. -Patient with multiple hospitalization and very poor quality of life. -Palliative care notes reviewed, discussed an ongoing with family members regarding goals of care.  # Sepsis due to group B streptococcus bacteremia: Patient with elevated lactate level, leukocytosis on admission.  -Infectious disease consult appreciated, recommended penicillin vs ceftriaxone daily for 6 weeks. I discussed with Dr. Tommy Medal from infectious disease yesterday and today, no change in plan. For now I will continue current dose of penicillin pending palliative care evaluation and plan. If family don't decide regarding goals of care,  patient likely needs to go to SNF with IV antibiotics and PICC line placement. I will wait for final goals of care discussion. -I will discontinue daily lab, minimize blood draws.  #Acute on chronic respiratory failure, hypoxemic: Patient was on ventilator on admission. Pulmonary critical care service evaluated the patient. Currently on trach collar with high flow oxygen. Respiratory therapist evaluation and treatment appreciated.  -Chest x-ray with no active disease. Continue to monitor. Patient has chronic trach. -Patient had a mild wheezing with thick secretions. Discussed with the respiratory therapist, changed nebulization to standing every 4 hourly. Added Mucomyst nebulization. Continue suctioning.  # Hypernatremia on admission - recurrent problem, -Serum sodium level is improved. Continue IV fluid and current management.  # Uncontrolled DM type II with Hyperglycemia:  HgbA1C 7.9 -Borderline low blood sugar level this morning, continued to feed, continue current insulin regimen. Patient with uncontrolled hyperglycemia on admission.   # Acute on chronic kidney disease: Likely prerenal and UTI -Serum creatinine level is improved, creatinine 1.1 today. Monitor BMP.               # History of HTN: Blood pressure acceptable today. Amlodipine on hold.   # Hypothyroidism Continue synthroid  # Elevated troponin likely in the setting of sepsis and renal failure.  -Echocardiogram with EF of 55-60% and a grade 1 diastolic dysfunction..   Principal Problem:   Acute metabolic encephalopathy Active Problems:   AKI (acute kidney injury) ([place])   Hypertension   Chronic obstructive pulmonary disease (HCC)   Hyperlipidemia   Anemia   Hypernatremia   Gastrostomy tube dependent (HCC)   Hyperglycemia   DM (diabetes mellitus) type II controlled peripheral vascular disorder (HCC)   Acute on chronic respiratory failure (  [place])   Malnutrition of moderate degree   Sepsis due to Streptococcus  agalactiae ([facility])   Tracheostomy status ([place])   Dehydration   Palliative care by specialist  DVT prophylaxis: Changed to Lovenox subcutaneous Code Status: Full code Family Communication: No family present at bedside Disposition Plan: Likely discharge to nursing home in  few days. Depending on clinical improvement and palliative care consults evaluation.    Consultants:   palliative care and critical care.  Procedures: Mechanical ventilator on admission Antimicrobials:  Vancomycin and cefepime on 1/28.  Started penicillin since 1/29  Subjective: Patient was seen and examined at bedside. No new event. Review of systems Limited. Objective: Vitals:   12/01/16 0741 12/01/16 0830 12/01/16 1105 12/01/16 1230  BP: 140/64 (!) 154/66 (!) 113/53 (!) 127/54  Pulse: 75 75 84 86  Resp: (!) 23 18 (!) 22 19  Temp: 98.5 F (36.9 C)   98.7 F (37.1 C)  TempSrc: Axillary   Axillary  SpO2: 100%  100% 100%  Weight:      Height:        Intake/Output Summary (Last 24 hours) at 12/01/16 1454 Last data filed at 12/01/16 1000  Gross per 24 hour  Intake           2977.5 ml  Output                0 ml  Net           2977.5 ml   Filed Weights   11/29/16 0457 11/30/16 0400 12/01/16 0415  Weight: 45.4 kg (100 lb 1.4 oz) 47.5 kg (104 lb 11.2 oz) 47.6 kg (105 lb)    Examination:  General exam: Ill-looking female somnolent, unresponsive. Unchanged Tracheostomy site clean, not on ventilator.  Respiratory system: Bilateral diffuse intermittent wheezing Cardiovascular system: S1 & S2 heard, RRR.  No pedal edema. Gastrointestinal system: Abdomen is nondistended, soft. Normal bowel sounds heard. PEG site clean. Central nervous system: Lethargic Extremities: No edema Skin: No rashes, lesions or ulcers Psychiatry: Lethargic, not responsive.    Data Reviewed: I have personally reviewed following labs and imaging studies  CBC:  Recent Labs Lab 11/26/16 0639 11/26/16 0835  11/27/16 0222  11/28/16 0523 11/29/16 0326 11/30/16 0254 12/01/16 0246  WBC 9.1 20.4*  < > 8.7 26.5* 17.7* 19.7* 19.6*  NEUTROABS 7.8* 18.2*  --   --   --   --   --   --   HGB 5.3* 9.3*  < > 8.5* 8.6* 8.7* 8.5* 8.3*  HCT 17.5* 31.5*  < > 28.3* 28.4* 27.7* 27.6* 27.0*  MCV 109.4* 108.6*  < > 106.4* 105.6* 102.2* 104.2* 103.4*  PLT 140* 300  < > 205 196 188 183 204  < > = values in this interval not displayed. Basic Metabolic Panel:  Recent Labs Lab 11/27/16 0750 11/28/16 0523 11/29/16 0326 11/30/16 0254 12/01/16 0246  NA 150* 145 137 140 138  K 5.3* 5.2* 5.4* 5.3* 5.0  CL 125* 119* 111 116* 113*  CO2 18* 19* 18* 17* 18*  GLUCOSE 401* 202* 198* 133* 120*  BUN 109* 85* 65* 46* 31*  CREATININE 2.06* 1.73* 1.49* 1.28* 1.10*  CALCIUM 8.4* 8.8* 8.4* 8.2* 8.4*   GFR: Estimated Creatinine Clearance: 27.3 mL/min (by C-G formula based on SCr of 1.1 mg/dL (H)). Liver Function Tests:  Recent Labs Lab 11/26/16 0835 11/27/16 0222  AST 50* 40  ALT 48 40  ALKPHOS 279* 313*  BILITOT 0.7 0.3  PROT 7.4 6.3*  ALBUMIN 2.2* 1.9*   No results for input(s): LIPASE, AMYLASE in the last 168 hours. No results for input(s): AMMONIA in the last 168 hours. Coagulation Profile: No results for input(s): INR, PROTIME in the last 168 hours. Cardiac Enzymes:  Recent Labs Lab 11/26/16 0835 11/26/16 1457 11/26/16 2002 11/27/16 0210  TROPONINI 0.52* 0.37* 0.33* 0.30*   BNP (last 3 results) No results for input(s): PROBNP in the last 8760 hours. HbA1C: No results for input(s): HGBA1C in the last 72 hours. CBG:  Recent Labs Lab 12/01/16 0357 12/01/16 0433 12/01/16 0739 12/01/16 0740 12/01/16 1228  GLUCAP 69 106* 107* 121* 108*   Lipid Profile: No results for input(s): CHOL, HDL, LDLCALC, TRIG, CHOLHDL, LDLDIRECT in the last 72 hours. Thyroid Function Tests: No results for input(s): TSH, T4TOTAL, FREET4, T3FREE, THYROIDAB in the last 72 hours. Anemia Panel: No results for input(s): VITAMINB12,  FOLATE, FERRITIN, TIBC, IRON, RETICCTPCT in the last 72 hours. Sepsis Labs:  Recent Labs Lab 11/27/16 0750 11/27/16 1038 11/29/16 0317 11/30/16 0809  LATICACIDVEN 1.0 3.7* 2.3* 2.6*    Recent Results (from the past 240 hour(s))  Blood Culture (routine x 2)     Status: Abnormal   Collection Time: 11/26/16  5:44 AM  Result Value Ref Range Status   Specimen Description BLOOD LEFT HAND  Final   Special Requests IN PEDIATRIC BOTTLE 1CC  Final   Culture  Setup Time   Final    GRAM POSITIVE COCCI IN CLUSTERS IN PEDIATRIC BOTTLE CRITICAL RESULT CALLED TO, READ BACK BY AND VERIFIED WITH: RUTH CLARK,PHARMD AT 9476 11/27/16 G.MCADOO    Culture (A)  Final    GROUP B STREP(S.AGALACTIAE)ISOLATED SUSCEPTIBILITIES PERFORMED ON PREVIOUS CULTURE WITHIN THE LAST 5 DAYS.    Report Status 11/28/2016 FINAL  Final  Blood Culture (routine x 2)     Status: Abnormal   Collection Time: 11/26/16  6:00 AM  Result Value Ref Range Status   Specimen Description BLOOD RIGHT FOREARM  Final   Special Requests BOTTLES DRAWN AEROBIC AND ANAEROBIC 5CC  Final   Culture  Setup Time   Final    GRAM POSITIVE COCCI IN CHAINS IN BOTH AEROBIC AND ANAEROBIC BOTTLES CRITICAL RESULT CALLED TO, READ BACK BY AND VERIFIED WITH: A JOHNSTON,PHARMD AT 2058 11/26/16 BY J FUDESCO    Culture GROUP B STREP(S.AGALACTIAE)ISOLATED (A)  Final   Report Status 11/28/2016 FINAL  Final   Organism ID, Bacteria GROUP B STREP(S.AGALACTIAE)ISOLATED  Final      Susceptibility   Group b strep(s.agalactiae)isolated - MIC*    CLINDAMYCIN <=0.25 SENSITIVE Sensitive     AMPICILLIN <=0.25 SENSITIVE Sensitive     ERYTHROMYCIN <=0.12 SENSITIVE Sensitive     VANCOMYCIN 0.5 SENSITIVE Sensitive     CEFTRIAXONE <=0.12 SENSITIVE Sensitive     LEVOFLOXACIN 1 SENSITIVE Sensitive     * GROUP B STREP(S.AGALACTIAE)ISOLATED  Blood Culture ID Panel (Reflexed)     Status: Abnormal   Collection Time: 11/26/16  6:00 AM  Result Value Ref Range Status    Enterococcus species NOT DETECTED NOT DETECTED Final   Listeria monocytogenes NOT DETECTED NOT DETECTED Final   Staphylococcus species NOT DETECTED NOT DETECTED Final   Staphylococcus aureus NOT DETECTED NOT DETECTED Final   Streptococcus species DETECTED (A) NOT DETECTED Final    Comment: CRITICAL RESULT CALLED TO, READ BACK BY AND VERIFIED WITH: A JOHNSTON, PHARM, 11/26/16 AT 2058 BY J FUDESCO    Streptococcus agalactiae DETECTED (A) NOT DETECTED Final    Comment: CRITICAL RESULT  CALLED TO, READ BACK BY AND VERIFIED WITH: A JOHNSTON, PHARM, 11/26/16 AT 2058 BY J FUDESCO    Streptococcus pneumoniae NOT DETECTED NOT DETECTED Final   Streptococcus pyogenes NOT DETECTED NOT DETECTED Final   Acinetobacter baumannii NOT DETECTED NOT DETECTED Final   Enterobacteriaceae species NOT DETECTED NOT DETECTED Final   Enterobacter cloacae complex NOT DETECTED NOT DETECTED Final   Escherichia coli NOT DETECTED NOT DETECTED Final   Klebsiella oxytoca NOT DETECTED NOT DETECTED Final   Klebsiella pneumoniae NOT DETECTED NOT DETECTED Final   Proteus species NOT DETECTED NOT DETECTED Final   Serratia marcescens NOT DETECTED NOT DETECTED Final   Haemophilus influenzae NOT DETECTED NOT DETECTED Final   Neisseria meningitidis NOT DETECTED NOT DETECTED Final   Pseudomonas aeruginosa NOT DETECTED NOT DETECTED Final   Candida albicans NOT DETECTED NOT DETECTED Final   Candida glabrata NOT DETECTED NOT DETECTED Final   Candida krusei NOT DETECTED NOT DETECTED Final   Candida parapsilosis NOT DETECTED NOT DETECTED Final   Candida tropicalis NOT DETECTED NOT DETECTED Final  Culture, respiratory     Status: None   Collection Time: 11/26/16  9:24 AM  Result Value Ref Range Status   Specimen Description TRACHEAL ASPIRATE  Final   Special Requests NONE  Final   Gram Stain   Final    RARE WBC PRESENT, PREDOMINANTLY PMN RARE GRAM POSITIVE COCCI IN CLUSTERS RARE GRAM POSITIVE RODS    Culture   Final    ABUNDANT  METHICILLIN RESISTANT STAPHYLOCOCCUS AUREUS   Report Status 11/28/2016 FINAL  Final   Organism ID, Bacteria METHICILLIN RESISTANT STAPHYLOCOCCUS AUREUS  Final      Susceptibility   Methicillin resistant staphylococcus aureus - MIC*    CIPROFLOXACIN >=8 RESISTANT Resistant     ERYTHROMYCIN >=8 RESISTANT Resistant     GENTAMICIN <=0.5 SENSITIVE Sensitive     OXACILLIN >=4 RESISTANT Resistant     TETRACYCLINE <=1 SENSITIVE Sensitive     VANCOMYCIN <=0.5 SENSITIVE Sensitive     TRIMETH/SULFA >=320 RESISTANT Resistant     CLINDAMYCIN <=0.25 SENSITIVE Sensitive     RIFAMPIN <=0.5 SENSITIVE Sensitive     Inducible Clindamycin NEGATIVE Sensitive     * ABUNDANT METHICILLIN RESISTANT STAPHYLOCOCCUS AUREUS  Urine culture     Status: Abnormal   Collection Time: 11/26/16 10:08 AM  Result Value Ref Range Status   Specimen Description URINE, RANDOM  Final   Special Requests NONE  Final   Culture <10,000 COLONIES/mL INSIGNIFICANT GROWTH (A)  Final   Report Status 11/27/2016 FINAL  Final  MRSA PCR Screening     Status: Abnormal   Collection Time: 11/26/16  7:33 PM  Result Value Ref Range Status   MRSA by PCR POSITIVE (A) NEGATIVE Final    Comment:        The GeneXpert MRSA Assay (FDA approved for NASAL specimens only), is one component of a comprehensive MRSA colonization surveillance program. It is not intended to diagnose MRSA infection nor to guide or monitor treatment for MRSA infections. RESULT CALLED TO, READ BACK BY AND VERIFIED WITH: [place] [id number] AT 2302 SKEEN,P   Culture, blood (routine x 2)     Status: None (Preliminary result)   Collection Time: 11/28/16  5:15 AM  Result Value Ref Range Status   Specimen Description BLOOD LEFT HAND  Final   Special Requests IN PEDIATRIC BOTTLE 1ML  Final   Culture NO GROWTH 2 DAYS  Final   Report Status PENDING  Incomplete  Culture, blood (  routine x 2)     Status: None (Preliminary result)   Collection Time: 11/28/16  5:21 AM    Result Value Ref Range Status   Specimen Description BLOOD RIGHT HAND  Final   Special Requests IN PEDIATRIC BOTTLE 2ML  Final   Culture NO GROWTH 2 DAYS  Final   Report Status PENDING  Incomplete         Radiology Studies: No results found.      Scheduled Meds: . sodium chloride  10 mL/hr Intravenous Once  . acetylcysteine  3 mL Nebulization BID  . amantadine  100 mg Oral Daily  . aspirin  81 mg Oral Daily  . atorvastatin  40 mg Oral Daily  . chlorhexidine gluconate (MEDLINE KIT)  15 mL Mouth Rinse BID  . docusate  100 mg Oral BID  . famotidine  20 mg Oral Daily  . fentaNYL  12.5 mcg Transdermal Q72H  . ferrous sulfate  300 mg Oral BID WC  . free water  200 mL Per Tube Q12H  . heparin  5,000 Units Subcutaneous Q8H  . insulin aspart  0-24 Units Subcutaneous Q4H  . insulin aspart  10 Units Subcutaneous Once  . insulin aspart  4 Units Subcutaneous TID WC  . insulin detemir  10 Units Subcutaneous QHS  . ipratropium-albuterol  3 mL Nebulization Q4H WA  . levothyroxine  25 mcg Oral QAC breakfast  . mouth rinse  15 mL Mouth Rinse QID  . pencillin G potassium IV  2 Million Units Intravenous Q4H  . sertraline  100 mg Oral QHS   Continuous Infusions: . sodium chloride 100 mL/hr at 12/01/16 0914  . feeding supplement (GLUCERNA 1.2 CAL) 1,000 mL (12/01/16 0450)     LOS: 5 days    Dron Tanna Furry, MD [facility] Pager [phone]  If 7PM-7AM, please contact night-coverage [email or web address] Password [email or web address] 12/01/2016, 2:54 PM

## 2016-12-01 NOTE — Progress Notes (Signed)
Hypoglycemic Event  CBG: 69  Treatment: Dextrose 50% 25ml  Symptoms: none  Follow-up CBG: Time: 4:34 AM  CBG Result: 106  Possible Reasons for Event: tube feed being refilled/replaced  Comments/MD notified:    Evelyn [name], Evelyn BevelsAmanda [name]

## 2016-12-02 DIAGNOSIS — E440 Moderate protein-calorie malnutrition: Secondary | ICD-10-CM

## 2016-12-02 LAB — GLUCOSE, CAPILLARY
GLUCOSE-CAPILLARY: 103 mg/dL — AB (ref 65–99)
GLUCOSE-CAPILLARY: 96 mg/dL (ref 65–99)
Glucose-Capillary: 97 mg/dL (ref 65–99)
Glucose-Capillary: 97 mg/dL (ref 65–99)
Glucose-Capillary: 151 mg/dL — ABNORMAL HIGH (ref 65–99)
Glucose-Capillary: 128 mg/dL — ABNORMAL HIGH (ref 65–99)

## 2016-12-02 NOTE — Progress Notes (Signed)
PROGRESS NOTE    Evelyn [name]  JXB:[id number] DOB: [birth year] DOA: 11/26/2016 PCP: Pcp Not In System   Brief Narrative: 83 y.o. female with medical history significant of hypertension, CVA, COPD with chronic respiratory failure with hypoxia, status post try tracheostomy, status post gastrostomy, anemia, previous history of acute kidney injury and hypernatremia during the last hospitalization in December 2017 who presented to them emergency department with altered mental status and respiratory distress. She is  A resident of the SNF and at baseline is not verbal, but during the last 24 hours she became less responsive and her her breathing became more labored.  Assessment & Plan:   # Chronic encephalopathy: CT scan of head with chronic stroke. No acute changes. Patient has trach, PEG tube for feeding and she is not verbal at baseline. I think this is her baseline. It was presumed acute metabolic encephalopathy on admission.  #Goals of care: Patient with chronic comorbidities, nonverbal, has trach and PEG.She is very minimally responsive to painful stimuli. Continue to monitor. Palliative care following. Discussed with the palliative care team who has ongoing discussion with the family members. I believe patient will benefit from comfort measures and possibly hospice care at this time. -Patient with multiple hospitalization and very poor quality of life. -Palliative care notes reviewed, and discussion with the family regarding goals of care ongoing. We will follow the plan.  # Sepsis due to group B streptococcus bacteremia: Patient with elevated lactate level, leukocytosis on admission.  -Infectious disease consult appreciated, recommended penicillin vs ceftriaxone daily for 6 weeks. - For now I will continue current dose of penicillin pending palliative care evaluation and plan. If family don't decide regarding goals of care, patient likely needs to go to SNF with IV antibiotics and PICC line  placement. I will wait for final goals of care discussion. -I will discontinue daily lab, minimize blood draws.  #Acute on chronic respiratory failure, hypoxemic: Patient was on ventilator on admission. Pulmonary critical care service evaluated the patient. Currently on trach collar with high flow oxygen. Respiratory therapist evaluation and treatment appreciated.  -Continue bronchodilators, nebulization, frequent suctioning and supportive care.  # Hypernatremia on admission - recurrent problem, -Serum sodium level is improved. Continue IV fluid and current management. Repeat lab in the morning.  # Uncontrolled DM type II with Hyperglycemia:  HgbA1C 7.9 -Borderline low blood sugar level this morning, continued to feed, continue current insulin regimen. Patient with uncontrolled hyperglycemia on admission.   # Acute on chronic kidney disease: Likely prerenal and UTI -Serum creatinine level is improved, creatinine 1.1 today. Monitor BMP.               # History of HTN: Blood pressure acceptable today. Amlodipine on hold.   # Hypothyroidism Continue synthroid  # Elevated troponin likely in the setting of sepsis and renal failure.  -Echocardiogram with EF of 55-60% and a grade 1 diastolic dysfunction..   Principal Problem:   Acute metabolic encephalopathy Active Problems:   AKI (acute kidney injury) ([place])   Hypertension   Chronic obstructive pulmonary disease (HCC)   Hyperlipidemia   Anemia   Hypernatremia   Gastrostomy tube dependent (HCC)   Hyperglycemia   DM (diabetes mellitus) type II controlled peripheral vascular disorder (HCC)   Acute on chronic respiratory failure (HCC)   Malnutrition of moderate degree   Sepsis due to Streptococcus agalactiae ([place])   Tracheostomy status ([place])   Dehydration   Palliative care by specialist  DVT prophylaxis: Changed to Lovenox  subcutaneous Code Status: Full code Family Communication: No family present at bedside Disposition Plan:  Likely discharge to nursing home in  few days. Depending on clinical improvement and palliative care consults evaluation.    Consultants:   palliative care and critical care.  Procedures: Mechanical ventilator on admission Antimicrobials:  Vancomycin and cefepime on 1/28.  Started penicillin since 1/29  Subjective: Patient was seen and examined at bedside. No new event. Review of systems Limited. Objective: Vitals:   12/02/16 0537 12/02/16 0746 12/02/16 0904 12/02/16 0905  BP: 131/65 (!) 131/56  (!) 110/53  Pulse: 78   83  Resp: 17 20  (!) 22  Temp: 98.6 F (37 C) 98.9 F (37.2 C)    TempSrc: Oral Axillary    SpO2:   100% 100%  Weight: 47.6 kg (105 lb)     Height:        Intake/Output Summary (Last 24 hours) at 12/02/16 1133 Last data filed at 12/02/16 0600  Gross per 24 hour  Intake             3055 ml  Output                0 ml  Net             3055 ml   Filed Weights   11/30/16 0400 12/01/16 0415 12/02/16 0537  Weight: 47.5 kg (104 lb 11.2 oz) 47.6 kg (105 lb) 47.6 kg (105 lb)    Examination:  General exam: Ill-looking female looks more alert. Opens eye with the name today.  Tracheostomy site clean, not on ventilator.  Respiratory system: Clear bilateral, no wheezing heard. Cardiovascular system: S1 & S2 heard, RRR.  No pedal edema. Gastrointestinal system: Abdomen is nondistended, soft. Normal bowel sounds heard. PEG site clean. Central nervous system: Lethargic Extremities: No edema Skin: No rashes, lesions or ulcers Psychiatry: Lethargic, not responsive.    Data Reviewed: I have personally reviewed following labs and imaging studies  CBC:  Recent Labs Lab 11/26/16 0639 11/26/16 0835  11/27/16 0222 11/28/16 0523 11/29/16 0326 11/30/16 0254 12/01/16 0246  WBC 9.1 20.4*  < > 8.7 26.5* 17.7* 19.7* 19.6*  NEUTROABS 7.8* 18.2*  --   --   --   --   --   --   HGB 5.3* 9.3*  < > 8.5* 8.6* 8.7* 8.5* 8.3*  HCT 17.5* 31.5*  < > 28.3* 28.4* 27.7*  27.6* 27.0*  MCV 109.4* 108.6*  < > 106.4* 105.6* 102.2* 104.2* 103.4*  PLT 140* 300  < > 205 196 188 183 204  < > = values in this interval not displayed. Basic Metabolic Panel:  Recent Labs Lab 11/27/16 0750 11/28/16 0523 11/29/16 0326 11/30/16 0254 12/01/16 0246  NA 150* 145 137 140 138  K 5.3* 5.2* 5.4* 5.3* 5.0  CL 125* 119* 111 116* 113*  CO2 18* 19* 18* 17* 18*  GLUCOSE 401* 202* 198* 133* 120*  BUN 109* 85* 65* 46* 31*  CREATININE 2.06* 1.73* 1.49* 1.28* 1.10*  CALCIUM 8.4* 8.8* 8.4* 8.2* 8.4*   GFR: Estimated Creatinine Clearance: 27.3 mL/min (by C-G formula based on SCr of 1.1 mg/dL (H)). Liver Function Tests:  Recent Labs Lab 11/26/16 0835 11/27/16 0222  AST 50* 40  ALT 48 40  ALKPHOS 279* 313*  BILITOT 0.7 0.3  PROT 7.4 6.3*  ALBUMIN 2.2* 1.9*   No results for input(s): LIPASE, AMYLASE in the last 168 hours. No results for input(s): AMMONIA in the last  168 hours. Coagulation Profile: No results for input(s): INR, PROTIME in the last 168 hours. Cardiac Enzymes:  Recent Labs Lab 11/26/16 0835 11/26/16 1457 11/26/16 2002 11/27/16 0210  TROPONINI 0.52* 0.37* 0.33* 0.30*   BNP (last 3 results) No results for input(s): PROBNP in the last 8760 hours. HbA1C: No results for input(s): HGBA1C in the last 72 hours. CBG:  Recent Labs Lab 12/01/16 1636 12/01/16 2006 12/01/16 2322 12/02/16 0426 12/02/16 0747  GLUCAP 88 168* 167* 97 103*   Lipid Profile: No results for input(s): CHOL, HDL, LDLCALC, TRIG, CHOLHDL, LDLDIRECT in the last 72 hours. Thyroid Function Tests: No results for input(s): TSH, T4TOTAL, FREET4, T3FREE, THYROIDAB in the last 72 hours. Anemia Panel: No results for input(s): VITAMINB12, FOLATE, FERRITIN, TIBC, IRON, RETICCTPCT in the last 72 hours. Sepsis Labs:  Recent Labs Lab 11/27/16 0750 11/27/16 1038 11/29/16 0317 11/30/16 0809  LATICACIDVEN 1.0 3.7* 2.3* 2.6*    Recent Results (from the past 240 hour(s))  Blood  Culture (routine x 2)     Status: Abnormal   Collection Time: 11/26/16  5:44 AM  Result Value Ref Range Status   Specimen Description BLOOD LEFT HAND  Final   Special Requests IN PEDIATRIC BOTTLE 1CC  Final   Culture  Setup Time   Final    GRAM POSITIVE COCCI IN CLUSTERS IN PEDIATRIC BOTTLE CRITICAL RESULT CALLED TO, READ BACK BY AND VERIFIED WITH: RUTH CLARK,PHARMD AT 1607 11/27/16 G.MCADOO    Culture (A)  Final    GROUP B STREP(S.AGALACTIAE)ISOLATED SUSCEPTIBILITIES PERFORMED ON PREVIOUS CULTURE WITHIN THE LAST 5 DAYS.    Report Status 11/28/2016 FINAL  Final  Blood Culture (routine x 2)     Status: Abnormal   Collection Time: 11/26/16  6:00 AM  Result Value Ref Range Status   Specimen Description BLOOD RIGHT FOREARM  Final   Special Requests BOTTLES DRAWN AEROBIC AND ANAEROBIC 5CC  Final   Culture  Setup Time   Final    GRAM POSITIVE COCCI IN CHAINS IN BOTH AEROBIC AND ANAEROBIC BOTTLES CRITICAL RESULT CALLED TO, READ BACK BY AND VERIFIED WITH: A JOHNSTON,PHARMD AT 2058 11/26/16 BY J FUDESCO    Culture GROUP B STREP(S.AGALACTIAE)ISOLATED (A)  Final   Report Status 11/28/2016 FINAL  Final   Organism ID, Bacteria GROUP B STREP(S.AGALACTIAE)ISOLATED  Final      Susceptibility   Group b strep(s.agalactiae)isolated - MIC*    CLINDAMYCIN <=0.25 SENSITIVE Sensitive     AMPICILLIN <=0.25 SENSITIVE Sensitive     ERYTHROMYCIN <=0.12 SENSITIVE Sensitive     VANCOMYCIN 0.5 SENSITIVE Sensitive     CEFTRIAXONE <=0.12 SENSITIVE Sensitive     LEVOFLOXACIN 1 SENSITIVE Sensitive     * GROUP B STREP(S.AGALACTIAE)ISOLATED  Blood Culture ID Panel (Reflexed)     Status: Abnormal   Collection Time: 11/26/16  6:00 AM  Result Value Ref Range Status   Enterococcus species NOT DETECTED NOT DETECTED Final   Listeria monocytogenes NOT DETECTED NOT DETECTED Final   Staphylococcus species NOT DETECTED NOT DETECTED Final   Staphylococcus aureus NOT DETECTED NOT DETECTED Final   Streptococcus species  DETECTED (A) NOT DETECTED Final    Comment: CRITICAL RESULT CALLED TO, READ BACK BY AND VERIFIED WITH: A JOHNSTON, PHARM, 11/26/16 AT 2058 BY J FUDESCO    Streptococcus agalactiae DETECTED (A) NOT DETECTED Final    Comment: CRITICAL RESULT CALLED TO, READ BACK BY AND VERIFIED WITH: A JOHNSTON, PHARM, 11/26/16 AT 2058 BY J FUDESCO    Streptococcus pneumoniae NOT DETECTED  NOT DETECTED Final   Streptococcus pyogenes NOT DETECTED NOT DETECTED Final   Acinetobacter baumannii NOT DETECTED NOT DETECTED Final   Enterobacteriaceae species NOT DETECTED NOT DETECTED Final   Enterobacter cloacae complex NOT DETECTED NOT DETECTED Final   Escherichia coli NOT DETECTED NOT DETECTED Final   Klebsiella oxytoca NOT DETECTED NOT DETECTED Final   Klebsiella pneumoniae NOT DETECTED NOT DETECTED Final   Proteus species NOT DETECTED NOT DETECTED Final   Serratia marcescens NOT DETECTED NOT DETECTED Final   Haemophilus influenzae NOT DETECTED NOT DETECTED Final   Neisseria meningitidis NOT DETECTED NOT DETECTED Final   Pseudomonas aeruginosa NOT DETECTED NOT DETECTED Final   Candida albicans NOT DETECTED NOT DETECTED Final   Candida glabrata NOT DETECTED NOT DETECTED Final   Candida krusei NOT DETECTED NOT DETECTED Final   Candida parapsilosis NOT DETECTED NOT DETECTED Final   Candida tropicalis NOT DETECTED NOT DETECTED Final  Culture, respiratory     Status: None   Collection Time: 11/26/16  9:24 AM  Result Value Ref Range Status   Specimen Description TRACHEAL ASPIRATE  Final   Special Requests NONE  Final   Gram Stain   Final    RARE WBC PRESENT, PREDOMINANTLY PMN RARE GRAM POSITIVE COCCI IN CLUSTERS RARE GRAM POSITIVE RODS    Culture   Final    ABUNDANT METHICILLIN RESISTANT STAPHYLOCOCCUS AUREUS   Report Status 11/28/2016 FINAL  Final   Organism ID, Bacteria METHICILLIN RESISTANT STAPHYLOCOCCUS AUREUS  Final      Susceptibility   Methicillin resistant staphylococcus aureus - MIC*     CIPROFLOXACIN >=8 RESISTANT Resistant     ERYTHROMYCIN >=8 RESISTANT Resistant     GENTAMICIN <=0.5 SENSITIVE Sensitive     OXACILLIN >=4 RESISTANT Resistant     TETRACYCLINE <=1 SENSITIVE Sensitive     VANCOMYCIN <=0.5 SENSITIVE Sensitive     TRIMETH/SULFA >=320 RESISTANT Resistant     CLINDAMYCIN <=0.25 SENSITIVE Sensitive     RIFAMPIN <=0.5 SENSITIVE Sensitive     Inducible Clindamycin NEGATIVE Sensitive     * ABUNDANT METHICILLIN RESISTANT STAPHYLOCOCCUS AUREUS  Urine culture     Status: Abnormal   Collection Time: 11/26/16 10:08 AM  Result Value Ref Range Status   Specimen Description URINE, RANDOM  Final   Special Requests NONE  Final   Culture <10,000 COLONIES/mL INSIGNIFICANT GROWTH (A)  Final   Report Status 11/27/2016 FINAL  Final  MRSA PCR Screening     Status: Abnormal   Collection Time: 11/26/16  7:33 PM  Result Value Ref Range Status   MRSA by PCR POSITIVE (A) NEGATIVE Final    Comment:        The GeneXpert MRSA Assay (FDA approved for NASAL specimens only), is one component of a comprehensive MRSA colonization surveillance program. It is not intended to diagnose MRSA infection nor to guide or monitor treatment for MRSA infections. RESULT CALLED TO, READ BACK BY AND VERIFIED WITHCandise Che RN [id number] AT 2302 SKEEN,P   Culture, blood (routine x 2)     Status: None (Preliminary result)   Collection Time: 11/28/16  5:15 AM  Result Value Ref Range Status   Specimen Description BLOOD LEFT HAND  Final   Special Requests IN PEDIATRIC BOTTLE 1ML  Final   Culture NO GROWTH 3 DAYS  Final   Report Status PENDING  Incomplete  Culture, blood (routine x 2)     Status: None (Preliminary result)   Collection Time: 11/28/16  5:21 AM  Result Value Ref Range  Status   Specimen Description BLOOD RIGHT HAND  Final   Special Requests IN PEDIATRIC BOTTLE 2ML  Final   Culture NO GROWTH 3 DAYS  Final   Report Status PENDING  Incomplete         Radiology Studies: No  results found.      Scheduled Meds: . sodium chloride  10 mL/hr Intravenous Once  . acetylcysteine  3 mL Nebulization BID  . amantadine  100 mg Oral Daily  . aspirin  81 mg Oral Daily  . atorvastatin  40 mg Oral Daily  . chlorhexidine gluconate (MEDLINE KIT)  15 mL Mouth Rinse BID  . docusate  100 mg Oral BID  . famotidine  20 mg Oral Daily  . fentaNYL  12.5 mcg Transdermal Q72H  . ferrous sulfate  300 mg Oral BID WC  . free water  200 mL Per Tube Q12H  . heparin  5,000 Units Subcutaneous Q8H  . insulin aspart  0-24 Units Subcutaneous Q4H  . insulin aspart  10 Units Subcutaneous Once  . insulin aspart  4 Units Subcutaneous TID WC  . insulin detemir  10 Units Subcutaneous QHS  . ipratropium-albuterol  3 mL Nebulization Q4H WA  . levothyroxine  25 mcg Oral QAC breakfast  . mouth rinse  15 mL Mouth Rinse QID  . pencillin G potassium IV  2 Million Units Intravenous Q4H  . sertraline  100 mg Oral QHS   Continuous Infusions: . sodium chloride 100 mL/hr at 12/02/16 0456  . feeding supplement (GLUCERNA 1.2 CAL) 1,000 mL (12/02/16 0405)     LOS: 6 days    Suzanne Garbers Tanna Furry, MD [facility] Pager [phone]  If 7PM-7AM, please contact night-coverage [email or web address] Password [facility] 12/02/2016, 11:33 AM

## 2016-12-02 NOTE — Progress Notes (Signed)
Patient had 14 beats svt, appears asymptomatic, rhythm otherwise is normal sinus, have notified MD on call, will continue to monitor patient

## 2016-12-02 NOTE — Progress Notes (Signed)
Caryn BeeKevin, patient's son's friend, came to visit the patient.  Caryn BeeKevin was on the speakerphone with Dorinda Hillonald, patient's son.  I Informed him that MD, Dr. Neale BurlyFreeman, would like to talk to him in person.  He stated that he is sick with pneumonia at this time.  He will try to come here on Monday.

## 2016-12-02 NOTE — Progress Notes (Signed)
Daily Progress Note   Patient Name: Evelyn [name]       Date: 12/02/2016 DOB: 1930/05/24  Age: 83 y.o. MRN#: [id number] Attending Physician: Rosita Fire, MD Primary Care Physician: Pcp Not In System Admit Date: 11/26/2016  Reason for Consultation/Follow-up: Establishing goals of care  Subjective: Spoke with Evelyn [name] via phone this evening.  He reports having PNA and being unable to come to the hospital.  He and his brother have discussed care plan and wish to continue with current care moving forward.  Evelyn [name] expressed that his brother is moving back from [place] (next month), and they may change care plan to focus on comfort once that occurs and he is able to see his mother again.  At this time, however, family wished to continue with aggressive care.  He stated that they are agreeable to PICC line and transition back to SNF when medically stable to do so.  Review of Systems  Unable to perform ROS: Patient unresponsive    Length of Stay: 6  Current Medications: Scheduled Meds:  . sodium chloride  10 mL/hr Intravenous Once  . acetylcysteine  3 mL Nebulization BID  . amantadine  100 mg Oral Daily  . aspirin  81 mg Oral Daily  . atorvastatin  40 mg Oral Daily  . chlorhexidine gluconate (MEDLINE KIT)  15 mL Mouth Rinse BID  . docusate  100 mg Oral BID  . famotidine  20 mg Oral Daily  . fentaNYL  12.5 mcg Transdermal Q72H  . ferrous sulfate  300 mg Oral BID WC  . free water  200 mL Per Tube Q12H  . heparin  5,000 Units Subcutaneous Q8H  . insulin aspart  0-24 Units Subcutaneous Q4H  . insulin aspart  10 Units Subcutaneous Once  . insulin aspart  4 Units Subcutaneous TID WC  . insulin detemir  10 Units Subcutaneous QHS  . ipratropium-albuterol  3 mL Nebulization Q4H WA    . levothyroxine  25 mcg Oral QAC breakfast  . mouth rinse  15 mL Mouth Rinse QID  . pencillin G potassium IV  2 Million Units Intravenous Q4H  . sertraline  100 mg Oral QHS    Continuous Infusions: . sodium chloride 100 mL/hr at 12/02/16 1537  . feeding supplement (GLUCERNA 1.2 CAL) 1,000 mL (12/02/16 0405)  PRN Meds: acetaminophen **OR** acetaminophen, guaifenesin, HYDROcodone-acetaminophen, ipratropium-albuterol  Physical Exam  Constitutional:  cachetic  Abdominal: Soft. Bowel sounds are normal.  Musculoskeletal:  Both hands contracted, BLE rigid  Neurological:  nonresponsive  Skin: Skin is warm and dry.  Nursing note and vitals reviewed.           Vital Signs: BP (!) 150/79   Pulse 82   Temp 97.8 F (36.6 C) (Oral)   Resp (!) 22   Ht 5' (1.524 m)   Wt 47.6 kg (105 lb)   SpO2 100%   BMI 20.51 kg/m  SpO2: SpO2: 100 % O2 Device: O2 Device: Tracheostomy Collar O2 Flow Rate: O2 Flow Rate (L/min): 6 L/min  Intake/output summary:   Intake/Output Summary (Last 24 hours) at 12/02/16 1927 Last data filed at 12/02/16 1600  Gross per 24 hour  Intake             2395 ml  Output                0 ml  Net             2395 ml   LBM: Last BM Date: 12/02/16 Baseline Weight: Weight: 44.7 kg (98 lb 8.7 oz) Most recent weight: Weight: 47.6 kg (105 lb)       Palliative Assessment/Data: PPS: 10%   Flowsheet Rows   Flowsheet Row Most Recent Value  Intake Tab  Referral Department  Hospitalist  Unit at Time of Referral  Intermediate Care Unit  Palliative Care Primary Diagnosis  Pulmonary  Date Notified  11/27/16  Palliative Care Type  New Palliative care  Reason for referral  Clarify Goals of Care  Date of Admission  11/26/16  # of days IP prior to Palliative referral  1  Clinical Assessment  Psychosocial & Spiritual Assessment  Palliative Care Outcomes      Patient Active Problem List   Diagnosis Date Noted  . Palliative care by specialist   . Malnutrition  of moderate degree 11/27/2016  . Sepsis due to Streptococcus agalactiae ([place])   . Tracheostomy status ([place])   . Dehydration   . Hyperglycemia 11/26/2016  . DM (diabetes mellitus) type II controlled peripheral vascular disorder ([place]) 11/26/2016  . Acute metabolic encephalopathy 72/62/0355  . Acute on chronic respiratory failure ([place]) 11/26/2016  . Acute respiratory disease   . Septic shock ([place])   . Acute on chronic respiratory failure with hypoxia ([place])   . Pressure injury of skin 10/23/2016  . Goals of care, counseling/discussion   . Palliative care encounter   . AKI (acute kidney injury) ([place]) 10/19/2016  . Hypertension 10/19/2016  . Chronic obstructive pulmonary disease ([place]) 10/19/2016  . Hyperlipidemia 10/19/2016  . Anemia 10/19/2016  . Hypernatremia 10/19/2016  . Hypokalemia 10/19/2016  . Chronic respiratory failure with hypoxia ([place]) 10/19/2016  . Status post tracheostomy ([place]) 10/19/2016  . Gastrostomy tube dependent ([place]) 10/19/2016    Palliative Care Assessment & Plan   Patient Profile:  83 y.o. female  with past medical history of HTN, CVA, COPD (chronic resp failure w/ hypoxia, trach), PEG with continuous feeding, anemia, admitted on 11/26/2016 with altered mental status and respiratory distress.  Workup reveals strep bacteremia, on abx, but overall clinical picture is poor.   Assessment/Recommendations/Plan   Recommendation made to family earlier this admission for DNR, Hospice.  Family reports understanding reason for this recommendation, but family wants to continue with current, aggressive care plan.  They are agreeable with plan for SNF placement  with PICC for abx.  Her son Evelyn [name] reports that this may change once her other son returns from [place] next month.  Goals of Care and Additional Recommendations:  Limitations on Scope of Treatment: Full Scope Treatment  Code Status:  Full code  Prognosis:   Unable to determine  Discharge  Planning:  [facility] for rehab with Palliative care service follow-up most likely  Care plan was discussed with son, Evelyn [name]  Thank you for allowing the Palliative Medicine Team to assist in the care of this patient.   Total time: 25 minutes  Greater than 50%  of this time was spent counseling and coordinating care related to the above assessment and plan. Micheline Rough, MD [facility] Team [phone]    Please contact Palliative Medicine Team phone at [phone] for questions and concerns.

## 2016-12-03 DIAGNOSIS — D649 Anemia, unspecified: Secondary | ICD-10-CM

## 2016-12-03 LAB — CULTURE, BLOOD (ROUTINE X 2)
CULTURE: NO GROWTH
CULTURE: NO GROWTH

## 2016-12-03 LAB — BASIC METABOLIC PANEL
Anion gap: 10 (ref 5–15)
BUN: 20 mg/dL (ref 6–20)
CO2: 17 mmol/L — AB (ref 22–32)
Calcium: 8.4 mg/dL — ABNORMAL LOW (ref 8.9–10.3)
Chloride: 111 mmol/L (ref 101–111)
Creatinine, Ser: 0.95 mg/dL (ref 0.44–1.00)
GFR calc Af Amer: >60 mL/min (ref >60)
GFR calc non Af Amer: 53 mL/min — ABNORMAL LOW (ref >60)
Glucose, Bld: 134 mg/dL — ABNORMAL HIGH (ref 65–99)
POTASSIUM: 5.2 mmol/L — AB (ref 3.5–5.1)
Sodium: 138 mmol/L (ref 135–145)

## 2016-12-03 LAB — MAGNESIUM: Magnesium: 1.7 mg/dL (ref 1.7–2.4)

## 2016-12-03 LAB — CBC
HEMATOCRIT: 22.1 % — AB (ref 36.0–46.0)
Hemoglobin: 7 g/dL — ABNORMAL LOW (ref 12.0–15.0)
MCH: 32.6 pg (ref 26.0–34.0)
MCHC: 31.7 g/dL (ref 30.0–36.0)
MCV: 102.8 fL — AB (ref 78.0–100.0)
Platelets: 240 10*3/uL (ref 150–400)
RBC: 2.15 MIL/uL — AB (ref 3.87–5.11)
RDW: 16.9 % — ABNORMAL HIGH (ref 11.5–15.5)
WBC: 15.6 10*3/uL — AB (ref 4.0–10.5)

## 2016-12-03 LAB — GLUCOSE, CAPILLARY
GLUCOSE-CAPILLARY: 115 mg/dL — AB (ref 65–99)
GLUCOSE-CAPILLARY: 91 mg/dL (ref 65–99)
Glucose-Capillary: 113 mg/dL — ABNORMAL HIGH (ref 65–99)
Glucose-Capillary: 122 mg/dL — ABNORMAL HIGH (ref 65–99)
Glucose-Capillary: 126 mg/dL — ABNORMAL HIGH (ref 65–99)
Glucose-Capillary: 131 mg/dL — ABNORMAL HIGH (ref 65–99)

## 2016-12-03 MED ORDER — DEXTROSE 5 % IV SOLN
2.0000 g | INTRAVENOUS | Status: DC
  Administered 2016-12-03 – 2016-12-04 (×2): 2 g via INTRAVENOUS
  Filled 2016-12-03 (×2): qty 2

## 2016-12-03 MED ORDER — IPRATROPIUM-ALBUTEROL 0.5-2.5 (3) MG/3ML IN SOLN
3.0000 mL | Freq: Three times a day (TID) | RESPIRATORY_TRACT | Status: DC
  Administered 2016-12-03 (×3): 3 mL via RESPIRATORY_TRACT
  Filled 2016-12-03 (×3): qty 3

## 2016-12-03 MED ORDER — FUROSEMIDE 10 MG/ML IJ SOLN
20.0000 mg | Freq: Once | INTRAMUSCULAR | Status: AC
  Administered 2016-12-03: 20 mg via INTRAVENOUS
  Filled 2016-12-03: qty 2

## 2016-12-03 MED ORDER — IPRATROPIUM-ALBUTEROL 0.5-2.5 (3) MG/3ML IN SOLN
3.0000 mL | RESPIRATORY_TRACT | Status: DC
  Administered 2016-12-03 – 2016-12-04 (×6): 3 mL via RESPIRATORY_TRACT
  Filled 2016-12-03 (×6): qty 3

## 2016-12-03 NOTE — Progress Notes (Signed)
RN notified PICC will not be done today. 

## 2016-12-03 NOTE — Progress Notes (Addendum)
PROGRESS NOTE    Evelyn [name]  SJG:[id number] DOB: 1930-03-09 DOA: 11/26/2016 PCP: Pcp Not In System   Brief Narrative: 83 y.o. female with medical history significant of hypertension, CVA, COPD with chronic respiratory failure with hypoxia, status post try tracheostomy, status post gastrostomy, anemia, previous history of acute kidney injury and hypernatremia during the last hospitalization in December 2017 who presented to them emergency department with altered mental status and respiratory distress. She is  A resident of the SNF and at baseline is not verbal, but during the last 24 hours she became less responsive and her her breathing became more labored.  Assessment & Plan:   # Chronic encephalopathy: CT scan of head with chronic stroke. No acute changes. Patient has trach, PEG tube for feeding and she is not verbal at baseline. I think this is her baseline. It was presumed acute metabolic encephalopathy on admission.  #Goals of care: Patient with chronic comorbidities, nonverbal, has trach and PEG.She is very minimally responsive to painful stimuli. Continue to monitor. Palliative care following. Discussed with the palliative care team who has ongoing discussion with the family members. I believe patient will benefit from comfort measures and possibly hospice care at this time. -Patient with multiple hospitalization and very poor quality of life. -Palliative care notes reviewed, only decided to continue current care. I will go ahead and order PICC line for outpatient antibiotics.  # Sepsis due to group B streptococcus bacteremia: Patient with elevated lactate level, leukocytosis on admission.  -Infectious disease consult appreciated, recommended penicillin vs ceftriaxone daily for 6 weeks. - Planned to discharge with IV antibiotics. I will discuss with infectious disease tomorrow. PICC line ordered.   #Acute on chronic respiratory failure, hypoxemic: Patient was on ventilator on  admission. Pulmonary critical care service evaluated the patient. Currently on trach collar with high flow oxygen. Respiratory therapist evaluation and treatment appreciated.  -Continue bronchodilators, nebulization, frequent suctioning and supportive care.  # Hypernatremia on admission - recurrent problem, -Serum sodium level is improved. Discontinue IV fluid and free water. Continue tube feeding.  # Uncontrolled DM type II with Hyperglycemia:  HgbA1C 7.9 -Borderline low blood sugar level this morning, continued to feed, continue current insulin regimen. Patient with uncontrolled hyperglycemia on admission.   # Acute on chronic kidney disease: Likely prerenal and UTI -Serum creatinine level is improved, creatinine 1.1 today. Monitor BMP.  -Patient has borderline elevated potassium level. Repeat lab in the morning              # History of HTN: Blood pressure acceptable today. Amlodipine on hold.   # Hypothyroidism Continue synthroid  # Elevated troponin likely in the setting of sepsis and renal failure.  -Echocardiogram with EF of 55-60% and a grade 1 diastolic dysfunction.  #Anemia of chronic disease: Patient has drop in hemoglobin to 7 from 8.3 likely because of IV fluid and chronic disease. No sign of bleeding noticed. We will repeat lab in the morning. -Already on iron supplement.   Principal Problem:   Acute metabolic encephalopathy Active Problems:   AKI (acute kidney injury) ([place])   Hypertension   Chronic obstructive pulmonary disease (HCC)   Hyperlipidemia   Anemia   Hypernatremia   Gastrostomy tube dependent (HCC)   Hyperglycemia   DM (diabetes mellitus) type II controlled peripheral vascular disorder (HCC)   Acute on chronic respiratory failure (HCC)   Malnutrition of moderate degree   Sepsis due to Streptococcus agalactiae ([place])   Tracheostomy status (HCC)   Dehydration  Palliative care by specialist  DVT prophylaxis: Discontinue heparin because of severe  anemia. Code Status: Full code Family Communication: No family present at bedside Disposition Plan: Likely discharge to nursing home in 1-2 days with IV antibiotics.   Consultants:   palliative care and critical care.  Procedures: Mechanical ventilator on admission Antimicrobials:  Vancomycin and cefepime on 1/28.  Started penicillin since 1/29  Subjective: Patient was seen and examined at bedside. No new event. Remains unresponsive. Review of systems Limited.  Objective: Vitals:   12/03/16 0759 12/03/16 0841 12/03/16 0921 12/03/16 1100  BP: (!) 132/96 134/79 (!) 146/54 134/61  Pulse: 87 85 87 87  Resp: (!) 22 (!) 22 (!) 23 20  Temp: 99 F (37.2 C)     TempSrc: Axillary     SpO2: 100% 100% 100%   Weight:      Height:        Intake/Output Summary (Last 24 hours) at 12/03/16 1134 Last data filed at 12/03/16 0805  Gross per 24 hour  Intake             3250 ml  Output                0 ml  Net             3250 ml   Filed Weights   12/01/16 0415 12/02/16 0537 12/03/16 0337  Weight: 47.6 kg (105 lb) 47.6 kg (105 lb) 51.2 kg (112 lb 12.8 oz)    Examination:  General exam: Unresponsive female lying in bed, not in distress. Tracheostomy site clean, not on ventilator.  Respiratory system: Clear bilaterally, no wheezing heard.. Cardiovascular system: S1 & S2 heard, RRR.  No pedal edema. Gastrointestinal system: Abdomen is nondistended, soft. Normal bowel sounds heard. PEG site clean. Central nervous system: Lethargic Extremities: No edema Skin: No rashes, lesions or ulcers Psychiatry: Lethargic, not responsive.    Data Reviewed: I have personally reviewed following labs and imaging studies  CBC:  Recent Labs Lab 11/28/16 0523 11/29/16 0326 11/30/16 0254 12/01/16 0246 12/03/16 0228  WBC 26.5* 17.7* 19.7* 19.6* 15.6*  HGB 8.6* 8.7* 8.5* 8.3* 7.0*  HCT 28.4* 27.7* 27.6* 27.0* 22.1*  MCV 105.6* 102.2* 104.2* 103.4* 102.8*  PLT 196 188 183 204 027   Basic  Metabolic Panel:  Recent Labs Lab 11/28/16 0523 11/29/16 0326 11/30/16 0254 12/01/16 0246 12/03/16 0228  NA 145 137 140 138 138  K 5.2* 5.4* 5.3* 5.0 5.2*  CL 119* 111 116* 113* 111  CO2 19* 18* 17* 18* 17*  GLUCOSE 202* 198* 133* 120* 134*  BUN 85* 65* 46* 31* 20  CREATININE 1.73* 1.49* 1.28* 1.10* 0.95  CALCIUM 8.8* 8.4* 8.2* 8.4* 8.4*  MG  --   --   --   --  1.7   GFR: Estimated Creatinine Clearance: 31.7 mL/min (by C-G formula based on SCr of 0.95 mg/dL). Liver Function Tests:  Recent Labs Lab 11/27/16 0222  AST 40  ALT 40  ALKPHOS 313*  BILITOT 0.3  PROT 6.3*  ALBUMIN 1.9*   No results for input(s): LIPASE, AMYLASE in the last 168 hours. No results for input(s): AMMONIA in the last 168 hours. Coagulation Profile: No results for input(s): INR, PROTIME in the last 168 hours. Cardiac Enzymes:  Recent Labs Lab 11/26/16 1457 11/26/16 2002 11/27/16 0210  TROPONINI 0.37* 0.33* 0.30*   BNP (last 3 results) No results for input(s): PROBNP in the last 8760 hours. HbA1C: No results for input(s): HGBA1C in  the last 72 hours. CBG:  Recent Labs Lab 12/02/16 2019 12/02/16 2102 12/03/16 0010 12/03/16 0318 12/03/16 0801  GLUCAP 128* 96 113* 115* 131*   Lipid Profile: No results for input(s): CHOL, HDL, LDLCALC, TRIG, CHOLHDL, LDLDIRECT in the last 72 hours. Thyroid Function Tests: No results for input(s): TSH, T4TOTAL, FREET4, T3FREE, THYROIDAB in the last 72 hours. Anemia Panel: No results for input(s): VITAMINB12, FOLATE, FERRITIN, TIBC, IRON, RETICCTPCT in the last 72 hours. Sepsis Labs:  Recent Labs Lab 11/27/16 0750 11/27/16 1038 11/29/16 0317 11/30/16 0809  LATICACIDVEN 1.0 3.7* 2.3* 2.6*    Recent Results (from the past 240 hour(s))  Blood Culture (routine x 2)     Status: Abnormal   Collection Time: 11/26/16  5:44 AM  Result Value Ref Range Status   Specimen Description BLOOD LEFT HAND  Final   Special Requests IN PEDIATRIC BOTTLE 1CC   Final   Culture  Setup Time   Final    GRAM POSITIVE COCCI IN CLUSTERS IN PEDIATRIC BOTTLE CRITICAL RESULT CALLED TO, READ BACK BY AND VERIFIED WITH: RUTH CLARK,PHARMD AT 7371 11/27/16 G.[id number]    Culture (A)  Final    GROUP B STREP(S.AGALACTIAE)ISOLATED SUSCEPTIBILITIES PERFORMED ON PREVIOUS CULTURE WITHIN THE LAST 5 DAYS.    Report Status 11/28/2016 FINAL  Final  Blood Culture (routine x 2)     Status: Abnormal   Collection Time: 11/26/16  6:00 AM  Result Value Ref Range Status   Specimen Description BLOOD RIGHT FOREARM  Final   Special Requests BOTTLES DRAWN AEROBIC AND ANAEROBIC 5CC  Final   Culture  Setup Time   Final    GRAM POSITIVE COCCI IN CHAINS IN BOTH AEROBIC AND ANAEROBIC BOTTLES CRITICAL RESULT CALLED TO, READ BACK BY AND VERIFIED WITH: A JOHNSTON,PHARMD AT 2058 11/26/16 BY J FUDESCO    Culture GROUP B STREP(S.AGALACTIAE)ISOLATED (A)  Final   Report Status 11/28/2016 FINAL  Final   Organism ID, Bacteria GROUP B STREP(S.AGALACTIAE)ISOLATED  Final      Susceptibility   Group b strep(s.agalactiae)isolated - MIC*    CLINDAMYCIN <=0.25 SENSITIVE Sensitive     AMPICILLIN <=0.25 SENSITIVE Sensitive     ERYTHROMYCIN <=0.12 SENSITIVE Sensitive     VANCOMYCIN 0.5 SENSITIVE Sensitive     CEFTRIAXONE <=0.12 SENSITIVE Sensitive     LEVOFLOXACIN 1 SENSITIVE Sensitive     * GROUP B STREP(S.AGALACTIAE)ISOLATED  Blood Culture ID Panel (Reflexed)     Status: Abnormal   Collection Time: 11/26/16  6:00 AM  Result Value Ref Range Status   Enterococcus species NOT DETECTED NOT DETECTED Final   Listeria monocytogenes NOT DETECTED NOT DETECTED Final   Staphylococcus species NOT DETECTED NOT DETECTED Final   Staphylococcus aureus NOT DETECTED NOT DETECTED Final   Streptococcus species DETECTED (A) NOT DETECTED Final    Comment: CRITICAL RESULT CALLED TO, READ BACK BY AND VERIFIED WITH: A JOHNSTON, PHARM, 11/26/16 AT 2058 BY J FUDESCO    Streptococcus agalactiae DETECTED (A) NOT DETECTED  Final    Comment: CRITICAL RESULT CALLED TO, READ BACK BY AND VERIFIED WITH: A JOHNSTON, PHARM, 11/26/16 AT 2058 BY J FUDESCO    Streptococcus pneumoniae NOT DETECTED NOT DETECTED Final   Streptococcus pyogenes NOT DETECTED NOT DETECTED Final   Acinetobacter baumannii NOT DETECTED NOT DETECTED Final   Enterobacteriaceae species NOT DETECTED NOT DETECTED Final   Enterobacter cloacae complex NOT DETECTED NOT DETECTED Final   Escherichia coli NOT DETECTED NOT DETECTED Final   Klebsiella oxytoca NOT DETECTED NOT DETECTED Final  Klebsiella pneumoniae NOT DETECTED NOT DETECTED Final   Proteus species NOT DETECTED NOT DETECTED Final   Serratia marcescens NOT DETECTED NOT DETECTED Final   Haemophilus influenzae NOT DETECTED NOT DETECTED Final   Neisseria meningitidis NOT DETECTED NOT DETECTED Final   Pseudomonas aeruginosa NOT DETECTED NOT DETECTED Final   Candida albicans NOT DETECTED NOT DETECTED Final   Candida glabrata NOT DETECTED NOT DETECTED Final   Candida krusei NOT DETECTED NOT DETECTED Final   Candida parapsilosis NOT DETECTED NOT DETECTED Final   Candida tropicalis NOT DETECTED NOT DETECTED Final  Culture, respiratory     Status: None   Collection Time: 11/26/16  9:24 AM  Result Value Ref Range Status   Specimen Description TRACHEAL ASPIRATE  Final   Special Requests NONE  Final   Gram Stain   Final    RARE WBC PRESENT, PREDOMINANTLY PMN RARE GRAM POSITIVE COCCI IN CLUSTERS RARE GRAM POSITIVE RODS    Culture   Final    ABUNDANT METHICILLIN RESISTANT STAPHYLOCOCCUS AUREUS   Report Status 11/28/2016 FINAL  Final   Organism ID, Bacteria METHICILLIN RESISTANT STAPHYLOCOCCUS AUREUS  Final      Susceptibility   Methicillin resistant staphylococcus aureus - MIC*    CIPROFLOXACIN >=8 RESISTANT Resistant     ERYTHROMYCIN >=8 RESISTANT Resistant     GENTAMICIN <=0.5 SENSITIVE Sensitive     OXACILLIN >=4 RESISTANT Resistant     TETRACYCLINE <=1 SENSITIVE Sensitive     VANCOMYCIN  <=0.5 SENSITIVE Sensitive     TRIMETH/SULFA >=320 RESISTANT Resistant     CLINDAMYCIN <=0.25 SENSITIVE Sensitive     RIFAMPIN <=0.5 SENSITIVE Sensitive     Inducible Clindamycin NEGATIVE Sensitive     * ABUNDANT METHICILLIN RESISTANT STAPHYLOCOCCUS AUREUS  Urine culture     Status: Abnormal   Collection Time: 11/26/16 10:08 AM  Result Value Ref Range Status   Specimen Description URINE, RANDOM  Final   Special Requests NONE  Final   Culture <10,000 COLONIES/mL INSIGNIFICANT GROWTH (A)  Final   Report Status 11/27/2016 FINAL  Final  MRSA PCR Screening     Status: Abnormal   Collection Time: 11/26/16  7:33 PM  Result Value Ref Range Status   MRSA by PCR POSITIVE (A) NEGATIVE Final    Comment:        The GeneXpert MRSA Assay (FDA approved for NASAL specimens only), is one component of a comprehensive MRSA colonization surveillance program. It is not intended to diagnose MRSA infection nor to guide or monitor treatment for MRSA infections. RESULT CALLED TO, READ BACK BY AND VERIFIED WITHCandise Che RN [id number] AT 2302 SKEEN,P   Culture, blood (routine x 2)     Status: None (Preliminary result)   Collection Time: 11/28/16  5:15 AM  Result Value Ref Range Status   Specimen Description BLOOD LEFT HAND  Final   Special Requests IN PEDIATRIC BOTTLE 1ML  Final   Culture NO GROWTH 4 DAYS  Final   Report Status PENDING  Incomplete  Culture, blood (routine x 2)     Status: None (Preliminary result)   Collection Time: 11/28/16  5:21 AM  Result Value Ref Range Status   Specimen Description BLOOD RIGHT HAND  Final   Special Requests IN PEDIATRIC BOTTLE 2ML  Final   Culture NO GROWTH 4 DAYS  Final   Report Status PENDING  Incomplete         Radiology Studies: No results found.      Scheduled Meds: . sodium chloride  10 mL/hr Intravenous Once  . acetylcysteine  3 mL Nebulization BID  . amantadine  100 mg Oral Daily  . aspirin  81 mg Oral Daily  . atorvastatin  40 mg Oral  Daily  . chlorhexidine gluconate (MEDLINE KIT)  15 mL Mouth Rinse BID  . docusate  100 mg Oral BID  . famotidine  20 mg Oral Daily  . fentaNYL  12.5 mcg Transdermal Q72H  . ferrous sulfate  300 mg Oral BID WC  . heparin  5,000 Units Subcutaneous Q8H  . insulin aspart  0-24 Units Subcutaneous Q4H  . insulin aspart  10 Units Subcutaneous Once  . insulin aspart  4 Units Subcutaneous TID WC  . insulin detemir  10 Units Subcutaneous QHS  . ipratropium-albuterol  3 mL Nebulization TID  . levothyroxine  25 mcg Oral QAC breakfast  . mouth rinse  15 mL Mouth Rinse QID  . pencillin G potassium IV  2 Million Units Intravenous Q4H  . sertraline  100 mg Oral QHS   Continuous Infusions: . sodium chloride 100 mL/hr at 12/03/16 0420  . feeding supplement (GLUCERNA 1.2 CAL) 1,000 mL (12/03/16 0420)     LOS: 7 days    Mallika Sanmiguel Tanna Furry, MD [facility] Pager [phone]  If 7PM-7AM, please contact night-coverage [email or web address] Password [email or web address] 12/03/2016, 11:34 AM

## 2016-12-03 NOTE — Progress Notes (Signed)
Patient potassium 5.2.  Lungs sounds with increase in wheezes and fine crackles.  Dr. Ronalee BeltsBhandari notified.  Orders received and carried out.

## 2016-12-04 ENCOUNTER — Inpatient Hospital Stay (HOSPITAL_COMMUNITY): Payer: Medicare Other

## 2016-12-04 LAB — BASIC METABOLIC PANEL
ANION GAP: 10 (ref 5–15)
BUN: 21 mg/dL — ABNORMAL HIGH (ref 6–20)
CALCIUM: 8.8 mg/dL — AB (ref 8.9–10.3)
CO2: 18 mmol/L — ABNORMAL LOW (ref 22–32)
CREATININE: 0.98 mg/dL (ref 0.44–1.00)
Chloride: 108 mmol/L (ref 101–111)
GFR calc Af Amer: 60 mL/min — ABNORMAL LOW (ref >60)
GFR, EST NON AFRICAN AMERICAN: 52 mL/min — AB (ref >60)
Glucose, Bld: 146 mg/dL — ABNORMAL HIGH (ref 65–99)
Potassium: 4.9 mmol/L (ref 3.5–5.1)
SODIUM: 136 mmol/L (ref 135–145)

## 2016-12-04 LAB — CBC
HEMATOCRIT: 24.7 % — AB (ref 36.0–46.0)
Hemoglobin: 7.8 g/dL — ABNORMAL LOW (ref 12.0–15.0)
MCH: 32.2 pg (ref 26.0–34.0)
MCHC: 31.6 g/dL (ref 30.0–36.0)
MCV: 102.1 fL — ABNORMAL HIGH (ref 78.0–100.0)
PLATELETS: 254 10*3/uL (ref 150–400)
RBC: 2.42 MIL/uL — ABNORMAL LOW (ref 3.87–5.11)
RDW: 17.1 % — AB (ref 11.5–15.5)
WBC: 16.9 10*3/uL — AB (ref 4.0–10.5)

## 2016-12-04 LAB — GLUCOSE, CAPILLARY
GLUCOSE-CAPILLARY: 179 mg/dL — AB (ref 65–99)
GLUCOSE-CAPILLARY: 69 mg/dL (ref 65–99)
GLUCOSE-CAPILLARY: 74 mg/dL (ref 65–99)
Glucose-Capillary: 136 mg/dL — ABNORMAL HIGH (ref 65–99)
Glucose-Capillary: 157 mg/dL — ABNORMAL HIGH (ref 65–99)
Glucose-Capillary: 157 mg/dL — ABNORMAL HIGH (ref 65–99)

## 2016-12-04 MED ORDER — GLUCERNA 1.2 CAL PO LIQD: 1000.0000 mL | ORAL | Status: AC

## 2016-12-04 MED ORDER — INSULIN ASPART 100 UNIT/ML ~~LOC~~ SOLN: 4.0000 [IU] | Freq: Three times a day (TID) | SUBCUTANEOUS | 0 refills | Status: AC

## 2016-12-04 MED ORDER — INSULIN DETEMIR 100 UNIT/ML ~~LOC~~ SOLN: 10.0000 [IU] | Freq: Every day | SUBCUTANEOUS | 0 refills | Status: AC

## 2016-12-04 MED ORDER — SODIUM CHLORIDE 0.9% FLUSH: 10.0000 mL | INTRAVENOUS | Status: DC | PRN

## 2016-12-04 MED ORDER — AMLODIPINE BESYLATE 10 MG PO TABS: 5.0000 mg | ORAL_TABLET | Freq: Every day | ORAL | Status: AC

## 2016-12-04 MED ORDER — DEXTROSE 50 % IV SOLN
INTRAVENOUS | Status: AC
  Administered 2016-12-04: 25 mL
  Filled 2016-12-04: qty 50

## 2016-12-04 MED ORDER — DEXTROSE 5 % IV SOLN: 2.0000 g | INTRAVENOUS | Status: AC

## 2016-12-04 MED ORDER — ORAL CARE MOUTH RINSE: 15.0000 mL | Freq: Four times a day (QID) | OROMUCOSAL | 0 refills | Status: AC

## 2016-12-04 MED ORDER — SODIUM CHLORIDE 0.9% FLUSH
10.0000 mL | Freq: Two times a day (BID) | INTRAVENOUS | Status: DC
  Administered 2016-12-04: 10 mL

## 2016-12-04 MED ORDER — FUROSEMIDE 10 MG/ML IJ SOLN
20.0000 mg | Freq: Once | INTRAMUSCULAR | Status: AC
  Administered 2016-12-04: 20 mg via INTRAVENOUS
  Filled 2016-12-04: qty 2

## 2016-12-04 NOTE — Discharge Summary (Addendum)
Physician Discharge Summary  Evelyn [name] XBJ:[id number] DOB: 24-Mar-1930 DOA: 11/26/2016  PCP: Pcp Not In System  Admit date: 11/26/2016 Discharge date: 12/04/2016  Admitted From:SNF Disposition:SNF  Recommendations for Outpatient Follow-up:  1. Follow up with PCP in 1-2 weeks 2. Please obtain BMP/CBC in one week 3. Remove picc line at the end of antibiotics course.   Home Health:no Equipment/Devices: trach, picc line Discharge Condition:stable CODE STATUS:full Diet recommendation:tube feed, glucerna  Brief/Interim Summary:83 y.o.femalewith medical history significant of hypertension, CVA, COPD with chronic respiratory failure with hypoxia, status post try tracheostomy, status post gastrostomy,anemia, previous history of acute kidney injury and hypernatremia during the last hospitalization in December 2017 who presented to them emergency department with altered mental status and respiratory distress. She is a resident of the SNF and at baseline is not verbal, but patient became less responsive and her her breathing became more labored.  # Chronic encephalopathy: CT scan of head with chronic stroke. No acute changes. Patient has trach, PEG tube for feeding and she is not verbal at baseline. I think this is her baseline. It was presumed acute metabolic encephalopathy on admission.  #Goals of care: Patient with chronic comorbidities, nonverbal, has trach and PEG.She is very minimally responsive to painful stimuli. Palliative care Team evaluated the patient and discussed with patient's family members. Family wants full code at this time.  -Patient with multiple hospitalization and very poor quality of life. Outpatient referral to palliative care made. Patient likely benefit from comfort measures.  # Sepsis due to group B streptococcus bacteremia: Patient with elevated lactate level, leukocytosis on admission.  -Infectious disease consult appreciated, treated with penicillin in the  hospital. I discussed with Dr. Daiva Eves from [facility] regarding discharge plan. He recommended to discharge with 2 g of ceftriaxone for 2 weeks starting from the last day of negative blood culture. PICC line was placed today. Antibiotic ordered.   #Acute on chronic respiratory failure, hypoxemic: Patient was on ventilator on admission. Pulmonary critical care service evaluated the patient.  -Currently on trach collar. Patient is stable.  # Hypernatremia on admission -recurrent problem, -Serum sodium level is improved. Continue to feeding. Monitor BMP in a week. May need free water from that tube feed if patient has hypernatremia.  # Uncontrolled DM type II with Hyperglycemia:  HgbA1C 7.9 -Blood sugar level fluctuating in the hospital. Continue to feed. Continue insulin regimen. Monitor blood sugar. Follow-up with PCP.  # Acute on chronic kidney disease: Likely prerenal and UTI -Serum creatinine level is improved. patient had mild hyperkalemia in the hospital likely in the setting of penicillin with potassium. Improved now.  # History of HTN: Reduce the dose of amlodipine. Continue to monitor blood pressure.  # Hypothyroidism Continue synthroid  # Elevated troponin likely in the setting of sepsis and renal failure.  -Echocardiogram with EF of 55-60% and a grade 1 diastolic dysfunction.  #Anemia of chronic disease: Monitor CBC. Hemoglobin is 7.8 today. On oral iron. No sign of bleeding.  # Acute pulmonary edema in xray: treated with intermittent IV lasix. F/u outpatient.   Patient is stable, remains hyporesponsive with only minimally responsive to painful stimuli. Discharge with IV antibiotics. Recommended to discontinue PICC line after the end of antibiotics treatment.  Discharge Diagnoses:  Principal Problem:   Acute metabolic encephalopathy Active Problems:   AKI (acute kidney injury) (HCC)   Hypertension   Chronic obstructive pulmonary disease (HCC)   Hyperlipidemia   Anemia    Hypernatremia   Gastrostomy tube dependent (  HCC)   Hyperglycemia   DM (diabetes mellitus) type II controlled peripheral vascular disorder (HCC)   Acute on chronic respiratory failure (HCC)   Malnutrition of moderate degree   Sepsis due to Streptococcus agalactiae (HCC)   Tracheostomy status (HCC)   Dehydration   Palliative care by specialist    Discharge Instructions  Discharge Instructions    Amb Referral to Palliative Care    Complete by:  As directed    Call MD for:  extreme fatigue    Complete by:  As directed    Call MD for:  persistant dizziness or light-headedness    Complete by:  As directed    Call MD for:  persistant nausea and vomiting    Complete by:  As directed    Call MD for:  severe uncontrolled pain    Complete by:  As directed    Call MD for:  temperature >100.4    Complete by:  As directed    Diet - low sodium heart healthy    Complete by:  As directed    Tube feed as she was on before.   Increase activity slowly    Complete by:  As directed      Allergies as of 12/04/2016      Reactions   Erythromycin Itching      Medication List    STOP taking these medications   oseltamivir 75 MG capsule Commonly known as:  TAMIFLU     TAKE these medications   amantadine 100 MG capsule Commonly known as:  SYMMETREL Take 100 mg by mouth daily.   amLODipine 10 MG tablet Commonly known as:  NORVASC Take 0.5 tablets (5 mg total) by mouth daily. What changed:  how much to take   aspirin 81 MG chewable tablet Chew 81 mg by mouth daily.   atorvastatin 40 MG tablet Commonly known as:  LIPITOR Take 40 mg by mouth daily.   cefTRIAXone 2 g in dextrose 5 % 50 mL Inject 2 g into the vein daily. Remove PICC line at the end of antibiotics.  Follow up with ID with any antibiotics related question.   docusate 50 MG/5ML liquid Commonly known as:  COLACE Take 100 mg by mouth 2 (two) times daily.   famotidine 20 MG tablet Commonly known as:  PEPCID Take 20 mg  by mouth daily.   fentaNYL 12 MCG/HR Commonly known as:  DURAGESIC - dosed mcg/hr Place 12.5 mcg onto the skin every 3 (three) days.   ferrous sulfate 300 (60 Fe) MG/5ML syrup Take 300 mg by mouth 2 (two) times daily with a meal.   free water Soln Place 200 mLs into feeding tube every 4 (four) hours.   GLUCERNA 1.5 CAL PO Place 80 mLs into feeding tube See admin instructions. 80 ml per hour for 15 hours 2p-5a   feeding supplement (GLUCERNA 1.2 CAL) Liqd Place 1,000 mLs into feeding tube continuous.   guaifenesin 100 MG/5ML syrup Commonly known as:  ROBITUSSIN Take 200 mg by mouth every 8 (eight) hours as needed for congestion.   insulin aspart 100 UNIT/ML injection Commonly known as:  novoLOG Inject 4 Units into the skin 3 (three) times daily with meals. What changed:  how much to take  when to take this   insulin detemir 100 UNIT/ML injection Commonly known as:  LEVEMIR Inject 0.1 mLs (10 Units total) into the skin at bedtime.   ipratropium-albuterol 0.5-2.5 (3) MG/3ML Soln Commonly known as:  DUONEB Take 3 mLs  by nebulization every 8 (eight) hours as needed (wheezing).   levothyroxine 25 MCG tablet Commonly known as:  SYNTHROID, LEVOTHROID Take 25 mcg by mouth daily before breakfast.   mouth rinse Liqd solution 15 mLs by Mouth Rinse route QID.   multivitamin with minerals Tabs tablet Take 1 tablet by mouth daily.   sertraline 100 MG tablet Commonly known as:  ZOLOFT Take 100 mg by mouth at bedtime.      Follow-up Information    COMER, ROBERT, MD. Schedule an appointment as soon as possible for a visit in 2 week(s).   Specialty:  Infectious Diseases Contact information: [address] [place] [address] [phone]          Allergies  Allergen Reactions  . Erythromycin Itching    Consultations: Infectious disease Pulmonary and Critical care Palliative care  Procedures/Studies: Mechanical ventilator on  admission   Subjective: Patient was seen and examined at bedside. Patient is  very minimally responsive to painful stimuli, sternal rub. Unable to obtain review of system. No new event.  Discharge Exam: Vitals:   12/04/16 0800 12/04/16 1200  BP: 129/61   Pulse: 84   Resp: 19   Temp: 99.9 F (37.7 C) 99 F (37.2 C)   Vitals:   12/04/16 0352 12/04/16 0739 12/04/16 0800 12/04/16 1200  BP: (!) 143/83 137/63 129/61   Pulse: 91 87 84   Resp: (!) 21 18 19    Temp:   99.9 F (37.7 C) 99 F (37.2 C)  TempSrc:   Axillary Oral  SpO2: 100%  100%   Weight:      Height:        General: minimal response to sternal stimuli, not in distress Neck: Tracheostomy site clean, not in ventilator Cardiovascular: RRR, S1/S2 +, no rubs, no gallops Respiratory: Clear bilaterally, no wheezing appreciated today. Abdominal: Soft, NT, ND, bowel sounds +. PEG site clean Extremities: no edema, no cyanosis Neurology: Not responsive.   The results of significant diagnostics from this hospitalization (including imaging, microbiology, ancillary and laboratory) are listed below for reference.     Microbiology: Recent Results (from the past 240 hour(s))  Blood Culture (routine x 2)     Status: Abnormal   Collection Time: 11/26/16  5:44 AM  Result Value Ref Range Status   Specimen Description BLOOD LEFT HAND  Final   Special Requests IN PEDIATRIC BOTTLE 1CC  Final   Culture  Setup Time   Final    GRAM POSITIVE COCCI IN CLUSTERS IN PEDIATRIC BOTTLE CRITICAL RESULT CALLED TO, READ BACK BY AND VERIFIED WITH: RUTH CLARK,PHARMD AT 2130 11/27/16 G.MCADOO    Culture (A)  Final    GROUP B STREP(S.AGALACTIAE)ISOLATED SUSCEPTIBILITIES PERFORMED ON PREVIOUS CULTURE WITHIN THE LAST 5 DAYS.    Report Status 11/28/2016 FINAL  Final  Blood Culture (routine x 2)     Status: Abnormal   Collection Time: 11/26/16  6:00 AM  Result Value Ref Range Status   Specimen Description BLOOD RIGHT FOREARM  Final   Special  Requests BOTTLES DRAWN AEROBIC AND ANAEROBIC 5CC  Final   Culture  Setup Time   Final    GRAM POSITIVE COCCI IN CHAINS IN BOTH AEROBIC AND ANAEROBIC BOTTLES CRITICAL RESULT CALLED TO, READ BACK BY AND VERIFIED WITH: A JOHNSTON,PHARMD AT 2058 11/26/16 BY J FUDESCO    Culture GROUP B STREP(S.AGALACTIAE)ISOLATED (A)  Final   Report Status 11/28/2016 FINAL  Final   Organism ID, Bacteria GROUP B STREP(S.AGALACTIAE)ISOLATED  Final  Susceptibility   Group b strep(s.agalactiae)isolated - MIC*    CLINDAMYCIN <=0.25 SENSITIVE Sensitive     AMPICILLIN <=0.25 SENSITIVE Sensitive     ERYTHROMYCIN <=0.12 SENSITIVE Sensitive     VANCOMYCIN 0.5 SENSITIVE Sensitive     CEFTRIAXONE <=0.12 SENSITIVE Sensitive     LEVOFLOXACIN 1 SENSITIVE Sensitive     * GROUP B STREP(S.AGALACTIAE)ISOLATED  Blood Culture ID Panel (Reflexed)     Status: Abnormal   Collection Time: 11/26/16  6:00 AM  Result Value Ref Range Status   Enterococcus species NOT DETECTED NOT DETECTED Final   Listeria monocytogenes NOT DETECTED NOT DETECTED Final   Staphylococcus species NOT DETECTED NOT DETECTED Final   Staphylococcus aureus NOT DETECTED NOT DETECTED Final   Streptococcus species DETECTED (A) NOT DETECTED Final    Comment: CRITICAL RESULT CALLED TO, READ BACK BY AND VERIFIED WITH: A JOHNSTON, PHARM, 11/26/16 AT 2058 BY J FUDESCO    Streptococcus agalactiae DETECTED (A) NOT DETECTED Final    Comment: CRITICAL RESULT CALLED TO, READ BACK BY AND VERIFIED WITH: A JOHNSTON, PHARM, 11/26/16 AT 2058 BY J FUDESCO    Streptococcus pneumoniae NOT DETECTED NOT DETECTED Final   Streptococcus pyogenes NOT DETECTED NOT DETECTED Final   Acinetobacter baumannii NOT DETECTED NOT DETECTED Final   Enterobacteriaceae species NOT DETECTED NOT DETECTED Final   Enterobacter cloacae complex NOT DETECTED NOT DETECTED Final   Escherichia coli NOT DETECTED NOT DETECTED Final   Klebsiella oxytoca NOT DETECTED NOT DETECTED Final   Klebsiella  pneumoniae NOT DETECTED NOT DETECTED Final   Proteus species NOT DETECTED NOT DETECTED Final   Serratia marcescens NOT DETECTED NOT DETECTED Final   Haemophilus influenzae NOT DETECTED NOT DETECTED Final   Neisseria meningitidis NOT DETECTED NOT DETECTED Final   Pseudomonas aeruginosa NOT DETECTED NOT DETECTED Final   Candida albicans NOT DETECTED NOT DETECTED Final   Candida glabrata NOT DETECTED NOT DETECTED Final   Candida krusei NOT DETECTED NOT DETECTED Final   Candida parapsilosis NOT DETECTED NOT DETECTED Final   Candida tropicalis NOT DETECTED NOT DETECTED Final  Culture, respiratory     Status: None   Collection Time: 11/26/16  9:24 AM  Result Value Ref Range Status   Specimen Description TRACHEAL ASPIRATE  Final   Special Requests NONE  Final   Gram Stain   Final    RARE WBC PRESENT, PREDOMINANTLY PMN RARE GRAM POSITIVE COCCI IN CLUSTERS RARE GRAM POSITIVE RODS    Culture   Final    ABUNDANT METHICILLIN RESISTANT STAPHYLOCOCCUS AUREUS   Report Status 11/28/2016 FINAL  Final   Organism ID, Bacteria METHICILLIN RESISTANT STAPHYLOCOCCUS AUREUS  Final      Susceptibility   Methicillin resistant staphylococcus aureus - MIC*    CIPROFLOXACIN >=8 RESISTANT Resistant     ERYTHROMYCIN >=8 RESISTANT Resistant     GENTAMICIN <=0.5 SENSITIVE Sensitive     OXACILLIN >=4 RESISTANT Resistant     TETRACYCLINE <=1 SENSITIVE Sensitive     VANCOMYCIN <=0.5 SENSITIVE Sensitive     TRIMETH/SULFA >=320 RESISTANT Resistant     CLINDAMYCIN <=0.25 SENSITIVE Sensitive     RIFAMPIN <=0.5 SENSITIVE Sensitive     Inducible Clindamycin NEGATIVE Sensitive     * ABUNDANT METHICILLIN RESISTANT STAPHYLOCOCCUS AUREUS  Urine culture     Status: Abnormal   Collection Time: 11/26/16 10:08 AM  Result Value Ref Range Status   Specimen Description URINE, RANDOM  Final   Special Requests NONE  Final   Culture <10,000 COLONIES/mL INSIGNIFICANT GROWTH (A)  Final   Report Status 11/27/2016 FINAL  Final   MRSA PCR Screening     Status: Abnormal   Collection Time: 11/26/16  7:33 PM  Result Value Ref Range Status   MRSA by PCR POSITIVE (A) NEGATIVE Final    Comment:        The GeneXpert MRSA Assay (FDA approved for NASAL specimens only), is one component of a comprehensive MRSA colonization surveillance program. It is not intended to diagnose MRSA infection nor to guide or monitor treatment for MRSA infections. RESULT CALLED TO, READ BACK BY AND VERIFIED WITH: Ferman HammingKORSELIEN,A RN [id number] AT 2302 SKEEN,P   Culture, blood (routine x 2)     Status: None   Collection Time: 11/28/16  5:15 AM  Result Value Ref Range Status   Specimen Description BLOOD LEFT HAND  Final   Special Requests IN PEDIATRIC BOTTLE 1ML  Final   Culture NO GROWTH 5 DAYS  Final   Report Status 12/03/2016 FINAL  Final  Culture, blood (routine x 2)     Status: None   Collection Time: 11/28/16  5:21 AM  Result Value Ref Range Status   Specimen Description BLOOD RIGHT HAND  Final   Special Requests IN PEDIATRIC BOTTLE 2ML  Final   Culture NO GROWTH 5 DAYS  Final   Report Status 12/03/2016 FINAL  Final     Labs: BNP (last 3 results)  Recent Labs  11/26/16 0830 11/26/16 1457  BNP 399.0* 434.6*   Basic Metabolic Panel:  Recent Labs Lab 11/29/16 0326 11/30/16 0254 12/01/16 0246 12/03/16 0228 12/04/16 0202  NA 137 140 138 138 136  K 5.4* 5.3* 5.0 5.2* 4.9  CL 111 116* 113* 111 108  CO2 18* 17* 18* 17* 18*  GLUCOSE 198* 133* 120* 134* 146*  BUN 65* 46* 31* 20 21*  CREATININE 1.49* 1.28* 1.10* 0.95 0.98  CALCIUM 8.4* 8.2* 8.4* 8.4* 8.8*  MG  --   --   --  1.7  --    Liver Function Tests: No results for input(s): AST, ALT, ALKPHOS, BILITOT, PROT, ALBUMIN in the last 168 hours. No results for input(s): LIPASE, AMYLASE in the last 168 hours. No results for input(s): AMMONIA in the last 168 hours. CBC:  Recent Labs Lab 11/29/16 0326 11/30/16 0254 12/01/16 0246 12/03/16 0228 12/04/16 0202  WBC 17.7*  19.7* 19.6* 15.6* 16.9*  HGB 8.7* 8.5* 8.3* 7.0* 7.8*  HCT 27.7* 27.6* 27.0* 22.1* 24.7*  MCV 102.2* 104.2* 103.4* 102.8* 102.1*  PLT 188 183 204 240 254   Cardiac Enzymes: No results for input(s): CKTOTAL, CKMB, CKMBINDEX, TROPONINI in the last 168 hours. BNP: Invalid input(s): POCBNP CBG:  Recent Labs Lab 12/04/16 0013 12/04/16 0056 12/04/16 0403 12/04/16 0830 12/04/16 1209  GLUCAP 69 157* 179* 157* 136*   D-Dimer No results for input(s): DDIMER in the last 72 hours. Hgb A1c No results for input(s): HGBA1C in the last 72 hours. Lipid Profile No results for input(s): CHOL, HDL, LDLCALC, TRIG, CHOLHDL, LDLDIRECT in the last 72 hours. Thyroid function studies No results for input(s): TSH, T4TOTAL, T3FREE, THYROIDAB in the last 72 hours.  Invalid input(s): FREET3 Anemia work up No results for input(s): VITAMINB12, FOLATE, FERRITIN, TIBC, IRON, RETICCTPCT in the last 72 hours. Urinalysis    Component Value Date/Time   COLORURINE YELLOW 11/26/2016 1008   APPEARANCEUR CLEAR 11/26/2016 1008   LABSPEC 1.014 11/26/2016 1008   PHURINE 5.0 11/26/2016 1008   GLUCOSEU >=500 (A) 11/26/2016 1008   HGBUR NEGATIVE 11/26/2016  1008   BILIRUBINUR NEGATIVE 11/26/2016 1008   KETONESUR NEGATIVE 11/26/2016 1008   PROTEINUR NEGATIVE 11/26/2016 1008   NITRITE NEGATIVE 11/26/2016 1008   LEUKOCYTESUR SMALL (A) 11/26/2016 1008   Sepsis Labs Invalid input(s): PROCALCITONIN,  WBC,  LACTICIDVEN Microbiology Recent Results (from the past 240 hour(s))  Blood Culture (routine x 2)     Status: Abnormal   Collection Time: 11/26/16  5:44 AM  Result Value Ref Range Status   Specimen Description BLOOD LEFT HAND  Final   Special Requests IN PEDIATRIC BOTTLE 1CC  Final   Culture  Setup Time   Final    GRAM POSITIVE COCCI IN CLUSTERS IN PEDIATRIC BOTTLE CRITICAL RESULT CALLED TO, READ BACK BY AND VERIFIED WITH: RUTH CLARK,PHARMD AT 1610 11/27/16 G.MCADOO    Culture (A)  Final    GROUP B  STREP(S.AGALACTIAE)ISOLATED SUSCEPTIBILITIES PERFORMED ON PREVIOUS CULTURE WITHIN THE LAST 5 DAYS.    Report Status 11/28/2016 FINAL  Final  Blood Culture (routine x 2)     Status: Abnormal   Collection Time: 11/26/16  6:00 AM  Result Value Ref Range Status   Specimen Description BLOOD RIGHT FOREARM  Final   Special Requests BOTTLES DRAWN AEROBIC AND ANAEROBIC 5CC  Final   Culture  Setup Time   Final    GRAM POSITIVE COCCI IN CHAINS IN BOTH AEROBIC AND ANAEROBIC BOTTLES CRITICAL RESULT CALLED TO, READ BACK BY AND VERIFIED WITH: A JOHNSTON,PHARMD AT 2058 11/26/16 BY J FUDESCO    Culture GROUP B STREP(S.AGALACTIAE)ISOLATED (A)  Final   Report Status 11/28/2016 FINAL  Final   Organism ID, Bacteria GROUP B STREP(S.AGALACTIAE)ISOLATED  Final      Susceptibility   Group b strep(s.agalactiae)isolated - MIC*    CLINDAMYCIN <=0.25 SENSITIVE Sensitive     AMPICILLIN <=0.25 SENSITIVE Sensitive     ERYTHROMYCIN <=0.12 SENSITIVE Sensitive     VANCOMYCIN 0.5 SENSITIVE Sensitive     CEFTRIAXONE <=0.12 SENSITIVE Sensitive     LEVOFLOXACIN 1 SENSITIVE Sensitive     * GROUP B STREP(S.AGALACTIAE)ISOLATED  Blood Culture ID Panel (Reflexed)     Status: Abnormal   Collection Time: 11/26/16  6:00 AM  Result Value Ref Range Status   Enterococcus species NOT DETECTED NOT DETECTED Final   Listeria monocytogenes NOT DETECTED NOT DETECTED Final   Staphylococcus species NOT DETECTED NOT DETECTED Final   Staphylococcus aureus NOT DETECTED NOT DETECTED Final   Streptococcus species DETECTED (A) NOT DETECTED Final    Comment: CRITICAL RESULT CALLED TO, READ BACK BY AND VERIFIED WITH: A JOHNSTON, PHARM, 11/26/16 AT 2058 BY J FUDESCO    Streptococcus agalactiae DETECTED (A) NOT DETECTED Final    Comment: CRITICAL RESULT CALLED TO, READ BACK BY AND VERIFIED WITH: A JOHNSTON, PHARM, 11/26/16 AT 2058 BY J FUDESCO    Streptococcus pneumoniae NOT DETECTED NOT DETECTED Final   Streptococcus pyogenes NOT DETECTED NOT  DETECTED Final   Acinetobacter baumannii NOT DETECTED NOT DETECTED Final   Enterobacteriaceae species NOT DETECTED NOT DETECTED Final   Enterobacter cloacae complex NOT DETECTED NOT DETECTED Final   Escherichia coli NOT DETECTED NOT DETECTED Final   Klebsiella oxytoca NOT DETECTED NOT DETECTED Final   Klebsiella pneumoniae NOT DETECTED NOT DETECTED Final   Proteus species NOT DETECTED NOT DETECTED Final   Serratia marcescens NOT DETECTED NOT DETECTED Final   Haemophilus influenzae NOT DETECTED NOT DETECTED Final   Neisseria meningitidis NOT DETECTED NOT DETECTED Final   Pseudomonas aeruginosa NOT DETECTED NOT DETECTED Final   Candida albicans  NOT DETECTED NOT DETECTED Final   Candida glabrata NOT DETECTED NOT DETECTED Final   Candida krusei NOT DETECTED NOT DETECTED Final   Candida parapsilosis NOT DETECTED NOT DETECTED Final   Candida tropicalis NOT DETECTED NOT DETECTED Final  Culture, respiratory     Status: None   Collection Time: 11/26/16  9:24 AM  Result Value Ref Range Status   Specimen Description TRACHEAL ASPIRATE  Final   Special Requests NONE  Final   Gram Stain   Final    RARE WBC PRESENT, PREDOMINANTLY PMN RARE GRAM POSITIVE COCCI IN CLUSTERS RARE GRAM POSITIVE RODS    Culture   Final    ABUNDANT METHICILLIN RESISTANT STAPHYLOCOCCUS AUREUS   Report Status 11/28/2016 FINAL  Final   Organism ID, Bacteria METHICILLIN RESISTANT STAPHYLOCOCCUS AUREUS  Final      Susceptibility   Methicillin resistant staphylococcus aureus - MIC*    CIPROFLOXACIN >=8 RESISTANT Resistant     ERYTHROMYCIN >=8 RESISTANT Resistant     GENTAMICIN <=0.5 SENSITIVE Sensitive     OXACILLIN >=4 RESISTANT Resistant     TETRACYCLINE <=1 SENSITIVE Sensitive     VANCOMYCIN <=0.5 SENSITIVE Sensitive     TRIMETH/SULFA >=320 RESISTANT Resistant     CLINDAMYCIN <=0.25 SENSITIVE Sensitive     RIFAMPIN <=0.5 SENSITIVE Sensitive     Inducible Clindamycin NEGATIVE Sensitive     * ABUNDANT METHICILLIN  RESISTANT STAPHYLOCOCCUS AUREUS  Urine culture     Status: Abnormal   Collection Time: 11/26/16 10:08 AM  Result Value Ref Range Status   Specimen Description URINE, RANDOM  Final   Special Requests NONE  Final   Culture <10,000 COLONIES/mL INSIGNIFICANT GROWTH (A)  Final   Report Status 11/27/2016 FINAL  Final  MRSA PCR Screening     Status: Abnormal   Collection Time: 11/26/16  7:33 PM  Result Value Ref Range Status   MRSA by PCR POSITIVE (A) NEGATIVE Final    Comment:        The GeneXpert MRSA Assay (FDA approved for NASAL specimens only), is one component of a comprehensive MRSA colonization surveillance program. It is not intended to diagnose MRSA infection nor to guide or monitor treatment for MRSA infections. RESULT CALLED TO, READ BACK BY AND VERIFIED WITHFerman Hamming RN [id number] AT 2302 SKEEN,P   Culture, blood (routine x 2)     Status: None   Collection Time: 11/28/16  5:15 AM  Result Value Ref Range Status   Specimen Description BLOOD LEFT HAND  Final   Special Requests IN PEDIATRIC BOTTLE  Final   Culture NO GROWTH 5 DAYS  Final   Report Status 12/03/2016 FINAL  Final  Culture, blood (routine x 2)     Status: None   Collection Time: 11/28/16  5:21 AM  Result Value Ref Range Status   Specimen Description BLOOD RIGHT HAND  Final   Special Requests IN PEDIATRIC BOTTLE  Final   Culture NO GROWTH 5 DAYS  Final   Report Status 12/03/2016 FINAL  Final     Time coordinating discharge: 33 minutes  SIGNED:   Maxie Barb, MD  [facility] 12/04/2016, 12:20 PM  If 7PM-7AM, please contact night-coverage [email or web address] Password [email or web address]

## 2016-12-04 NOTE — Care Management Important Message (Signed)
Important Message  Patient Details  Name: Evelyn [name] MRN: [id number] Date of Birth: October 26, 1930   Medicare Important Message Given:  Yes    Karisha Marlin T, RN 12/04/2016, 3:13 PM

## 2016-12-04 NOTE — Progress Notes (Signed)
[facility]  Awaiting for someone to pick up the phone.

## 2016-12-04 NOTE — Progress Notes (Signed)
[facility] Anticipated discharge date: 2/5 Family notified: Ronelle Nighonald Transportation by Sharin MonsPTAR- RN to call when pt ready  CSW signing off.  Burna SisJenna H. Edras Wilford, LCSW Clinical Social Worker [phone]

## 2016-12-04 NOTE — Progress Notes (Signed)
Order was placed to change trach from #4 cuffed Shiley to #4 cuffless Shiley.  In attempt to pull cuffed trach out, met resistance and trach would not come out.  Made sure cuff was all the way deflated and trach still would not pull out.  Patient sputum began to go from white and frothy to red and frothy.  In order to not cause bleeding stopped attempts, notified RN who will notify MD for further instructions.

## 2016-12-04 NOTE — Progress Notes (Signed)
Hypoglycemic Event  CBG: 69  Treatment: 50% Dextrose 25ml  Symptoms: None  Follow-up CBG: Time: 0057 CBG Result: 157  Possible Reasons for Event: unknown  Comments/MD notified:     Evelyn [name] E Evelyn [name]

## 2016-12-04 NOTE — Progress Notes (Signed)
Attempted to call for report again.  Operator transferred the call but no one pick up the phone.

## 2016-12-04 NOTE — Procedures (Signed)
Asked to change trach \ Removed old trach mild resistance No bleeding, easily placed new trach without cuff Cap pos Equal BS  Mcarthur Rossettianiel J. Tyson AliasFeinstein, MD, FACP Pgr: [phone] [facility]

## 2016-12-04 NOTE — Progress Notes (Signed)
Attempted 3 times and I hung up and call them again after being on the phone for at 10 minutes. I informed the operator, Karena Addisonrene, that I will call transport and it is up to the nurse to read the paper that I will send with the transport team.

## 2016-12-04 NOTE — Progress Notes (Signed)
Trach changed by Dr Tyson AliasFeinstein to 4 cuffless trach. Some resistance with removing cuffed trach. No complications inserting new cuffless trach. BBS equal, ETCO2 positive for color change. RT secured with trach ties. VS satble. Will cont to monitor

## 2016-12-04 NOTE — Progress Notes (Signed)
Peripherally Inserted Central Catheter/Midline Placement  The IV Nurse has discussed with the patient and/or persons authorized to consent for the patient, the purpose of this procedure and the potential benefits and risks involved with this procedure.  The benefits include less needle sticks, lab draws from the catheter, and the patient may be discharged home with the catheter. Risks include, but not limited to, infection, bleeding, blood clot (thrombus formation), and puncture of an artery; nerve damage and irregular heartbeat and possibility to perform a PICC exchange if needed/ordered by physician.  Alternatives to this procedure were also discussed.  [employer] PICC patient education guide, fact sheet on infection prevention and patient information card has been provided to patient /or left at bedside.    PICC/Midline Placement Documentation        Lisabeth DevoidGibbs, Alexavier Tsutsui Jeanette 12/04/2016, 9:33 AM Phone consent obtained from son Colman CaterDonald Scott

## 2016-12-28 DEATH — deceased

## 2018-02-21 IMAGING — US US ABDOMEN LIMITED
1 series · 14 of 25 positions shown · non-contrast
Comparison: None.

CLINICAL DATA: Acute liver failure.  Increased AST, ALT, alk loss

EXAM:
US ABDOMEN LIMITED - RIGHT UPPER QUADRANT

[Series 1: us abdomen limited · 0.15mm/px · 14 of 33 slices shown]
[im 1/33]
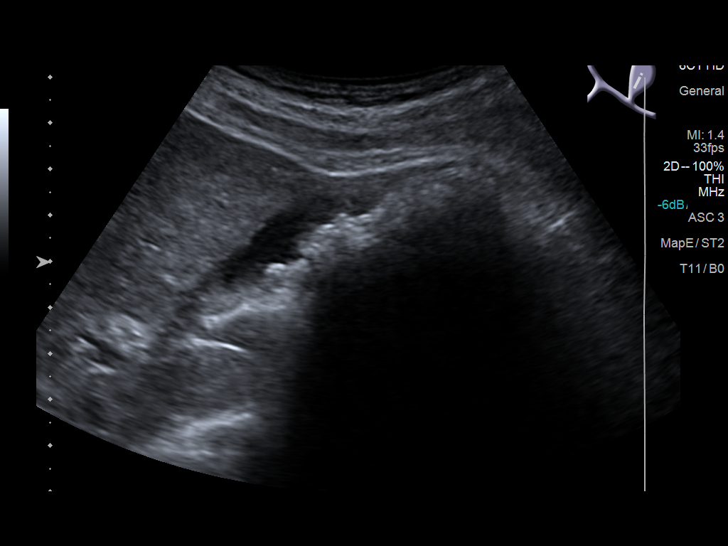
[im 3/33]
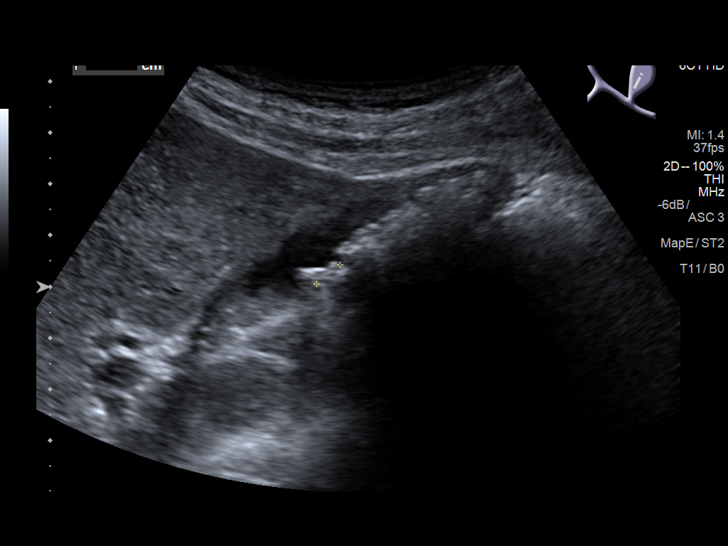
[im 6/33]
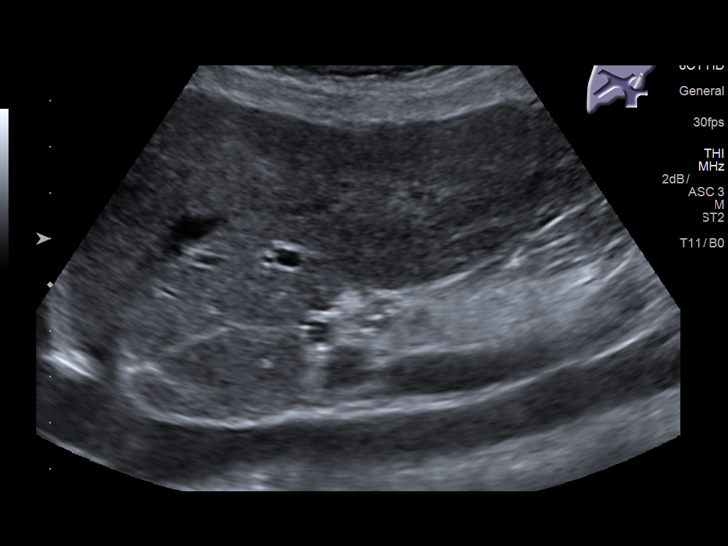
[im 9/33]
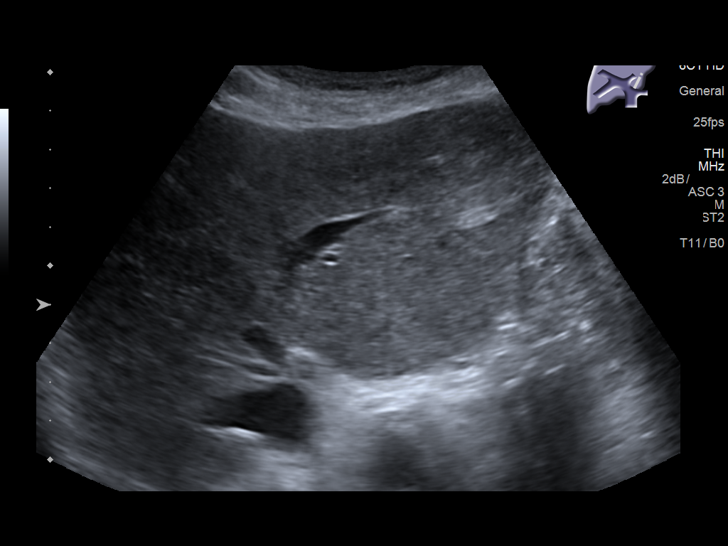
[im 11/33]
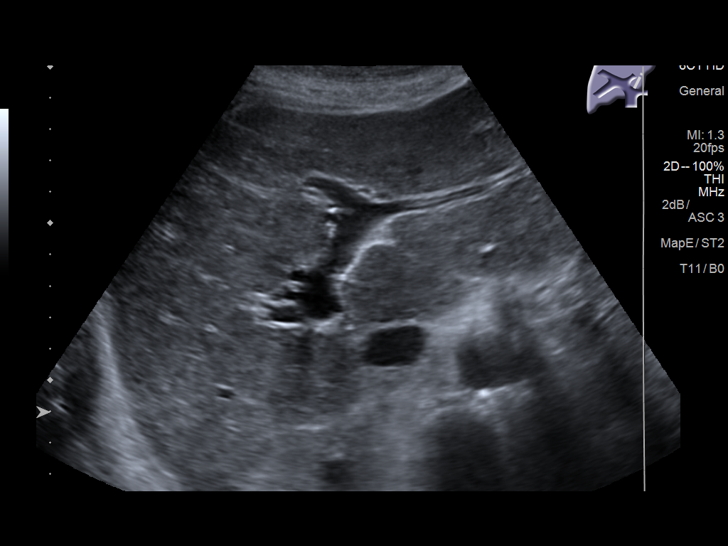
[im 13/33]
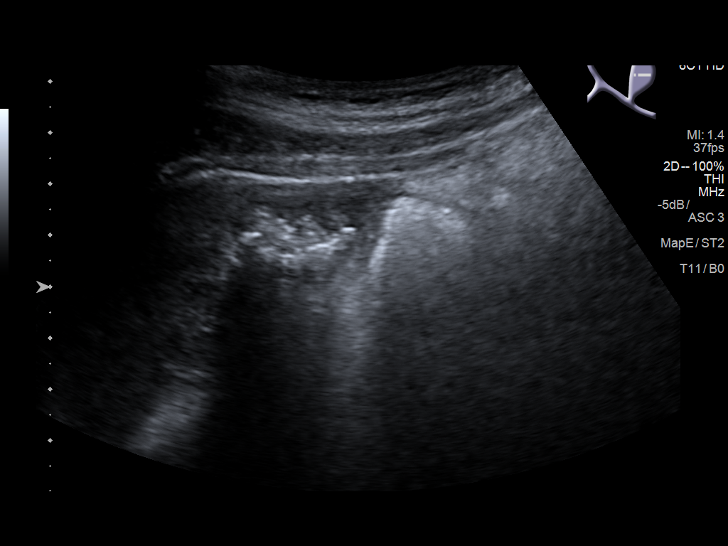
[im 15/33]
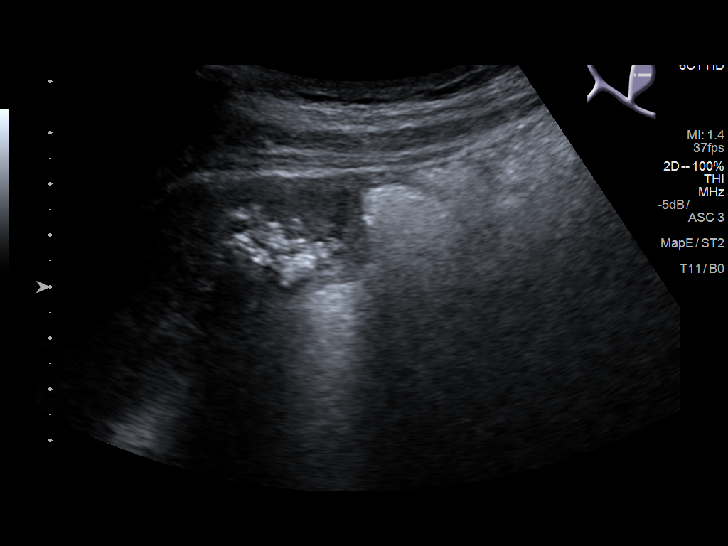
[im 18/33]
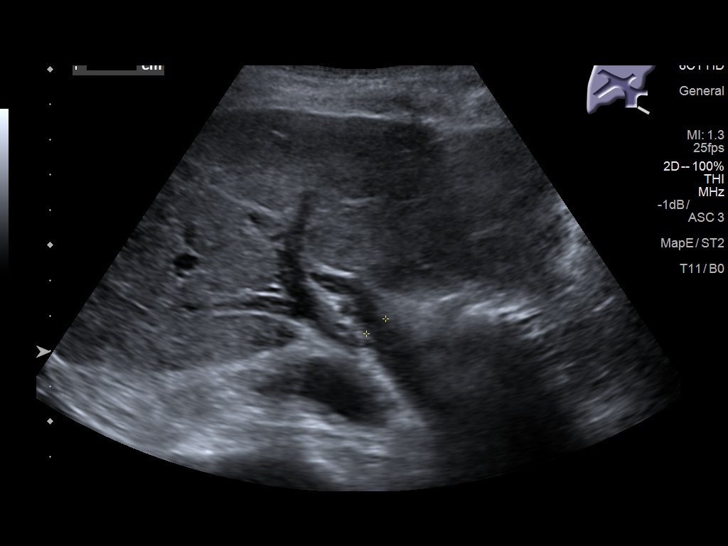
[im 21/33]
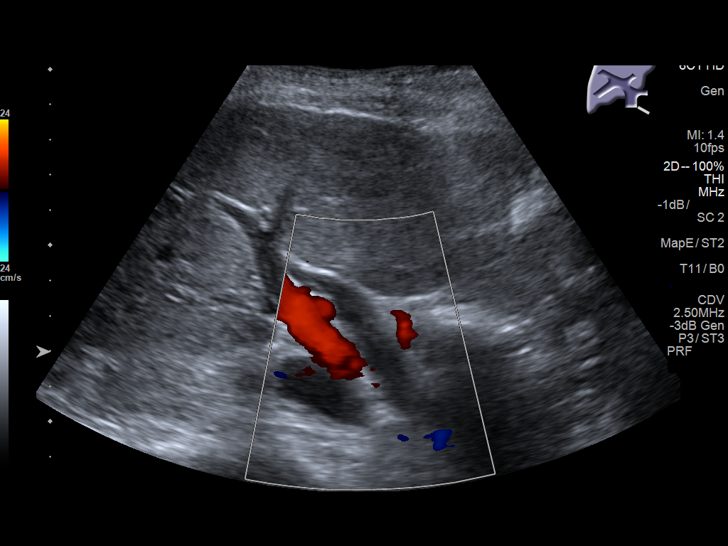
[im 22/33]
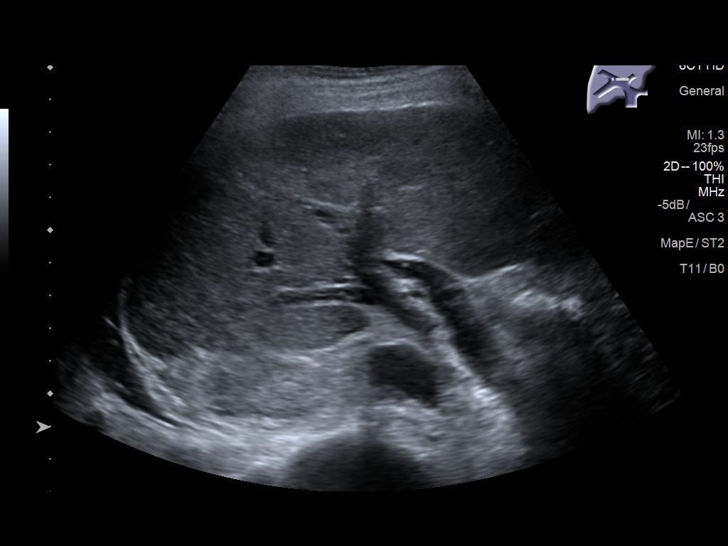
[im 25/33]
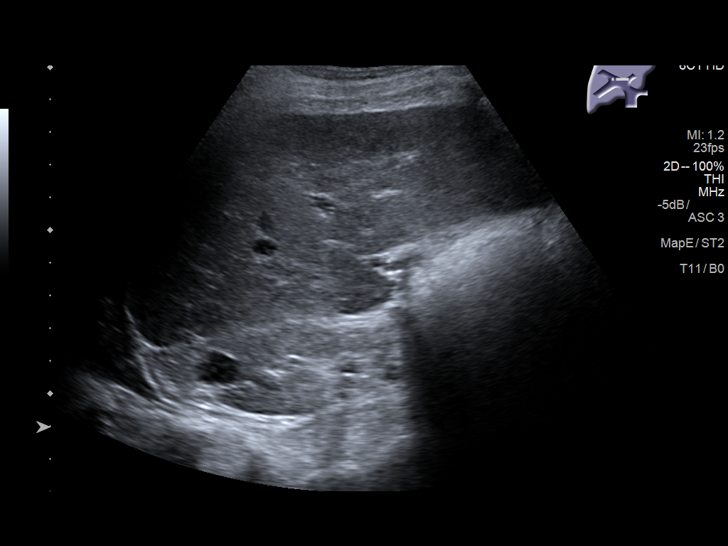
[im 27/33]
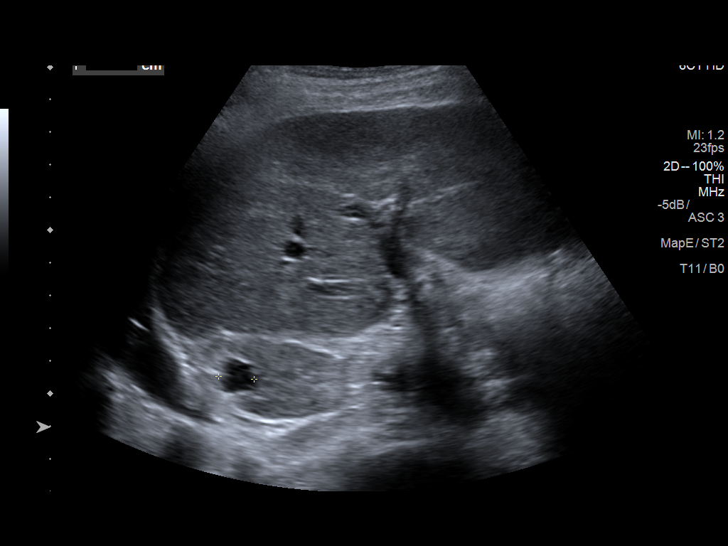
[im 30/33]
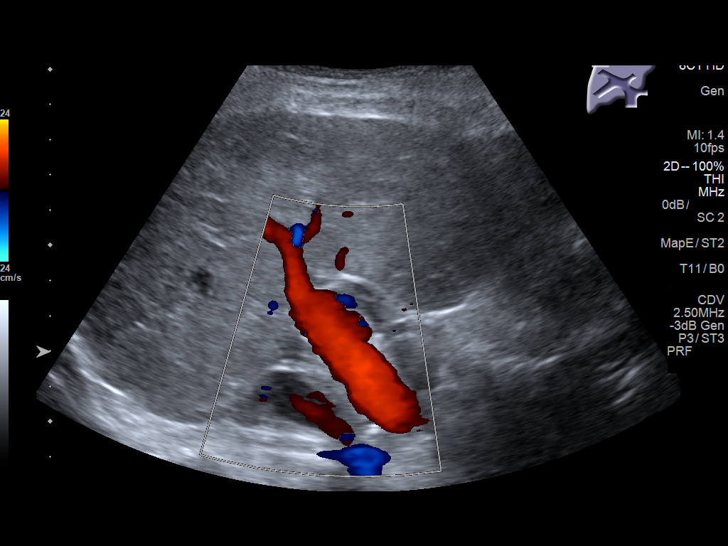
[im 33/33]
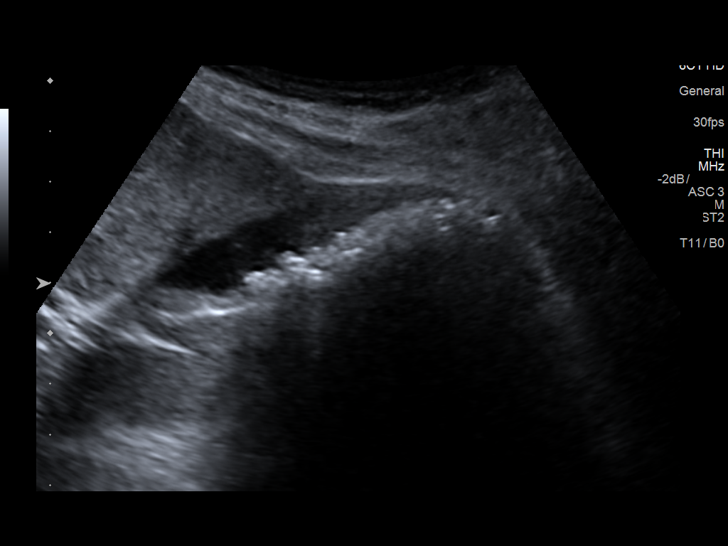

[14 of 25 positions shown; findings below may reference images not displayed]

FINDINGS: Gallbladder:

Cholelithiasis. No pericholecystic fluid or gallbladder wall
thickening. Murphy sign not be evaluated because the patient is on a
ventilator.

Common bile duct:

Diameter: 11 mm

Liver:

No focal hepatic mass. Heterogeneous increased hepatic echogenicity
with a slightly nodular contour.
IMPRESSION: 1. Heterogeneous increased hepatic echogenicity with a slightly
nodular contour as can be seen with hepatic cellular disease.
2. Cholelithiasis without sonographic evidence of acute
cholecystitis.
3. Mildly dilated distal common bile duct without
choledocholithiasis or obstructing mass.
